# Patient Record
Sex: Female | Born: 2000 | Race: Black or African American | Hispanic: No | Smoking: Never smoker
Health system: Southern US, Community
[De-identification: ages and names within clinical notes are randomized; demographics above are authoritative.]

## PROBLEM LIST (undated history)

## (undated) ENCOUNTER — Inpatient Hospital Stay: Payer: Self-pay

## (undated) DIAGNOSIS — O169 Unspecified maternal hypertension, unspecified trimester: Secondary | ICD-10-CM

## (undated) DIAGNOSIS — D649 Anemia, unspecified: Secondary | ICD-10-CM

## (undated) HISTORY — PX: NO PAST SURGERIES: SHX2092

---
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
## 2020-09-12 NOTE — L&D Delivery Note (Signed)
Delivery Note  Date of delivery: 01/21/2021 Estimated Date of Delivery: 02/25/21 No LMP recorded. Patient is pregnant. EGA: [redacted]w[redacted]d  Delivery Note At 8:04 AM a viable female was delivered via Vaginal, Spontaneous (Presentation: Left Occiput Anterior).  APGAR: 9, 9; weight  pending. Placenta status: Spontaneous, Intact.  Cord: 3 vessels with the following complications: None.  Cord pH: n/a  First Stage: Labor onset: 0350 Augmentation : none Analgesia /Anesthesia intrapartum: Epidural  SROM at 0613  Olin Hauser presented to L&D with active labor. Marland Kitchen Epidural placed.   Second Stage: Complete dilation at 0731 Onset of pushing at 0731 FHR second stage Cat II Delivery at 0804 on 01/21/2021  She progressed to complete and had a spontaneous vaginal birth of a live female over an intact perineum. The fetal head was delivered in OA position with restitution to LOA. 1x nuchal cord resolved with summersault maneuver. Anterior then posterior shoulders delivered spontaneously. Baby placed on mom's abdomen and attended to by transition RN. Cord clamped and cut when pulseless by pt's mother. Cord blood obtained for newborn labs.  Third Stage: Placenta delivered  intact with 3VC at 0809 Placenta disposition: Pathology Uterine tone bogy at first with mod bleeding - massaged to firm and methergine 0.2mg  given IM Left room with bleeding min IV pitocin given for hemorrhage prophylaxis  Anesthesia: Epidural Episiotomy:  none Lacerations:  none Suture Repair: n/a Est. Blood Loss (mL):   Complications: none  Mom to postpartum.  Baby to Couplet care / Skin to Skin.  Newborn: Birth Weight: pending  Apgar Scores: 9, 9 Feeding planned: breastfeeding   Cyril Mourning, CNM 01/21/2021 8:22 AM

## 2020-12-15 LAB — HEPATITIS C ANTIBODY: HCV Ab: NEGATIVE

## 2020-12-15 LAB — OB RESULTS CONSOLE HEPATITIS B SURFACE ANTIGEN: Hepatitis B Surface Ag: NEGATIVE

## 2020-12-15 LAB — OB RESULTS CONSOLE RUBELLA ANTIBODY, IGM: Rubella: IMMUNE

## 2020-12-15 LAB — OB RESULTS CONSOLE HIV ANTIBODY (ROUTINE TESTING): HIV: NONREACTIVE

## 2020-12-15 LAB — OB RESULTS CONSOLE RPR: RPR: NONREACTIVE

## 2020-12-15 LAB — OB RESULTS CONSOLE VARICELLA ZOSTER ANTIBODY, IGG: Varicella: IMMUNE

## 2021-01-13 ENCOUNTER — Observation Stay
Admission: EM | Admit: 2021-01-13 | Discharge: 2021-01-13 | Disposition: A | Discharge status: Home / Self Care | Payer: Medicaid Other | Attending: Obstetrics and Gynecology | Admitting: Obstetrics and Gynecology

## 2021-01-13 ENCOUNTER — Encounter: Payer: Self-pay | Admitting: Obstetrics and Gynecology

## 2021-01-13 ENCOUNTER — Other Ambulatory Visit: Payer: Self-pay

## 2021-01-13 DIAGNOSIS — Z3A33 33 weeks gestation of pregnancy: Secondary | ICD-10-CM | POA: Insufficient documentation

## 2021-01-13 DIAGNOSIS — R109 Unspecified abdominal pain: Secondary | ICD-10-CM | POA: Diagnosis present

## 2021-01-13 DIAGNOSIS — O26893 Other specified pregnancy related conditions, third trimester: Principal | ICD-10-CM | POA: Insufficient documentation

## 2021-01-13 DIAGNOSIS — R102 Pelvic and perineal pain: Secondary | ICD-10-CM | POA: Insufficient documentation

## 2021-01-13 DIAGNOSIS — O26899 Other specified pregnancy related conditions, unspecified trimester: Secondary | ICD-10-CM | POA: Diagnosis present

## 2021-01-13 HISTORY — DX: Anemia, unspecified: D64.9

## 2021-01-13 LAB — URINALYSIS, ROUTINE W REFLEX MICROSCOPIC
Bilirubin Urine: NEGATIVE
Glucose, UA: NEGATIVE mg/dL
Hgb urine dipstick: NEGATIVE
Ketones, ur: NEGATIVE mg/dL
Nitrite: NEGATIVE
Protein, ur: 30 mg/dL — AB
Specific Gravity, Urine: 1.03 (ref 1.005–1.030)
pH: 6 (ref 5.0–8.0)

## 2021-01-13 LAB — CHLAMYDIA/NGC RT PCR (ARMC ONLY)
Chlamydia Tr: NOT DETECTED
N gonorrhoeae: NOT DETECTED

## 2021-01-13 LAB — WET PREP, GENITAL
Clue Cells Wet Prep HPF POC: NONE SEEN
Sperm: NONE SEEN
Trich, Wet Prep: NONE SEEN
Yeast Wet Prep HPF POC: NONE SEEN

## 2021-01-13 MED ORDER — CALCIUM CARBONATE ANTACID 500 MG PO CHEW: 2.0000 | CHEWABLE_TABLET | ORAL | Status: DC | PRN

## 2021-01-13 MED ORDER — ACETAMINOPHEN 325 MG PO TABS: 650.0000 mg | ORAL_TABLET | ORAL | Status: DC | PRN

## 2021-01-13 MED ORDER — DOCUSATE SODIUM 100 MG PO CAPS: 100.0000 mg | ORAL_CAPSULE | Freq: Every day | ORAL | Status: DC

## 2021-01-13 MED ORDER — TERBUTALINE SULFATE 1 MG/ML IJ SOLN
0.2500 mg | Freq: Once | INTRAMUSCULAR | Status: AC
  Administered 2021-01-13: 0.25 mg via SUBCUTANEOUS
  Filled 2021-01-13: qty 1

## 2021-01-13 MED ORDER — PRENATAL MULTIVITAMIN CH: 1.0000 | ORAL_TABLET | Freq: Every day | ORAL | Status: DC

## 2021-01-13 MED ORDER — ZOLPIDEM TARTRATE 5 MG PO TABS: 5.0000 mg | ORAL_TABLET | Freq: Every evening | ORAL | Status: DC | PRN

## 2021-01-13 NOTE — OB Triage Note (Signed)
Pt discharged in good condition w/ mom. RN at bedside for discharge instructions to come back with any vaginal bleeding, LOF, ctx, decreased fetal movement, ect. Pt verbalized understanding.

## 2021-01-13 NOTE — Discharge Summary (Signed)
Bethany Macdonald is a 20 y.o. female. She is at [redacted]w[redacted]d gestation. No LMP recorded. Patient is pregnant. Estimated Date of Delivery: 02/25/21  Prenatal care site: [facility]  Current pregnancy complicated by:  1. Late PNC- no records 2. Dating by [place] at 30.5wks at [facility] 3. Anemia- taking PO iron supplement  Chief complaint: sharp lower abd pains since yesterday afternoon.  - denies LOF or vaginal discharge.  - No VB - no recent IC.  - last prenatal visit about 3 weeks ago and next visit is later this week.   S: Resting comfortably. no CTX, no VB.no LOF,  Active fetal movement. Denies: HA, visual changes, SOB, or RUQ/epigastric pain  Maternal Medical History:   Past Medical History:  Diagnosis Date  . Anemia     Past Surgical History:  Procedure Laterality Date  . NO PAST SURGERIES      Allergies  Allergen Reactions  . Bee Venom     Prior to Admission medications   Medication Sig Start Date End Date Taking? Authorizing Provider  ferrous sulfate 325 (65 FE) MG EC tablet Take 325 mg by mouth 3 (three) times daily with meals.   Yes [provider]  albuterol (PROVENTIL HFA;VENTOLIN HFA) 108 (90 Base) MCG/ACT inhaler Inhale 2 puffs into the lungs every 6 (six) hours as needed for wheezing or shortness of breath. Patient not taking: Reported on 01/13/2021 11/30/17   Enid Derry, PA-C  amoxicillin (AMOXIL) 400 MG/5ML suspension Take 5 mLs (400 mg total) by mouth 3 (three) times daily. For 10 days Patient not taking: Reported on 01/13/2021 10/23/17   Faythe Ghee, PA-C  amoxicillin (AMOXIL) 400 MG/5ML suspension Take 400 mg by mouth 3 (three) times daily. Patient not taking: Reported on 01/13/2021    [provider]  brompheniramine-pseudoephedrine-DM 30-2-10 MG/5ML syrup Take 5 mLs by mouth 4 (four) times daily as needed. Patient not taking: Reported on 01/13/2021 10/24/17   Joni Reining, PA-C      Social History: She  reports that she has never  smoked. She has never used smokeless tobacco. She reports that she does not drink alcohol and does not use drugs.  Family History:  no history of gyn cancers  Review of Systems: A full review of systems was performed and negative except as noted in the HPI.     O:  There were no vitals taken for this visit. No results found for this or any previous visit (from the past 48 hour(s)).   Constitutional: NAD, AAOx3  HE/ENT: extraocular movements grossly intact, moist mucous membranes CV: RRR PULM: nl respiratory effort, CTABL     Abd: gravid, non-tender, non-distended, soft      Ext: Non-tender, Nonedematous   Psych: mood appropriate, speech normal Pelvic: SSE done: moderate erythema. Small amount yellow tinged dc noted. Cervix long and closed.    Fetal  monitoring: Cat I Appropriate for GA Baseline: 140bpm Variability: moderate Accelerations: present x >2 Decelerations absent Time    A/P: 20 y.o. [redacted]w[redacted]d here for antenatal surveillance for pelvic and abdominal pain.   Principle Diagnosis:  High risk pregnancy in third trimester   Labor: not present.   Fetal Wellbeing: Reassuring Cat 1 tracing with Reactive NST   UA  Poorly hydrated, given PO fluids for hydration.   Negative wet prep and GC/CT  urine culture added on.   D/c home stable, precautions reviewed, follow-up as scheduled.    Randa Ngo, CNM 01/13/2021  12:44 AM

## 2021-01-13 NOTE — OB Triage Note (Signed)
Pt G1P0 [redacted]w[redacted]d presents to birthplace via ED w/ c/o low abdominal pain radiating to vagina. Reports sharp constant pain 7.5/10 starting this afternoon. States possible ctx, not sure, no LOF, no vaginal bleeding, and positive fetal movement. VSS.

## 2021-01-14 LAB — CULTURE, OB URINE: Culture: NO GROWTH

## 2021-01-16 ENCOUNTER — Encounter: Payer: Self-pay | Admitting: Obstetrics and Gynecology

## 2021-01-16 ENCOUNTER — Observation Stay
Admission: EM | Admit: 2021-01-16 | Discharge: 2021-01-17 | Disposition: A | Discharge status: Home / Self Care | Payer: Medicaid Other | Attending: Certified Nurse Midwife | Admitting: Certified Nurse Midwife

## 2021-01-16 ENCOUNTER — Other Ambulatory Visit: Payer: Self-pay

## 2021-01-16 DIAGNOSIS — R102 Pelvic and perineal pain: Secondary | ICD-10-CM | POA: Insufficient documentation

## 2021-01-16 DIAGNOSIS — O26893 Other specified pregnancy related conditions, third trimester: Secondary | ICD-10-CM | POA: Diagnosis present

## 2021-01-16 DIAGNOSIS — Z3A34 34 weeks gestation of pregnancy: Secondary | ICD-10-CM | POA: Insufficient documentation

## 2021-01-16 LAB — URINALYSIS, COMPLETE (UACMP) WITH MICROSCOPIC
Bilirubin Urine: NEGATIVE
Glucose, UA: NEGATIVE mg/dL
Hgb urine dipstick: NEGATIVE
Ketones, ur: NEGATIVE mg/dL
Nitrite: NEGATIVE
Protein, ur: NEGATIVE mg/dL
Specific Gravity, Urine: 1.011 (ref 1.005–1.030)
pH: 7 (ref 5.0–8.0)

## 2021-01-16 LAB — WET PREP, GENITAL
Clue Cells Wet Prep HPF POC: NONE SEEN
Sperm: NONE SEEN
Trich, Wet Prep: NONE SEEN
Yeast Wet Prep HPF POC: NONE SEEN

## 2021-01-16 NOTE — OB Triage Note (Signed)
Pt arrives G1 P0 with c/o lower pelvic pain and feeling less fetal movement since around 1900 this evening. Pt denies any N/V/D and is in NAD.

## 2021-01-17 DIAGNOSIS — O26893 Other specified pregnancy related conditions, third trimester: Secondary | ICD-10-CM | POA: Diagnosis not present

## 2021-01-17 NOTE — Discharge Instructions (Signed)
First Stage of Labor Labor is your body's natural process of moving your baby and other structures, including the placenta and umbilical cord, out of your uterus. There are three stages of labor. How long each stage lasts is different for every woman. But certain events happen during each stage that are the same for everyone.  The first stage starts when true labor begins. This stage ends when your cervix, which is the opening from your uterus into your vagina, is completely open (dilated).  The second stage begins when your cervix is fully dilated and you start pushing. This stage ends when your baby is born.  The third stage is the delivery of the organ that nourished your baby during pregnancy (placenta). First stage of labor As your due date gets closer, you may start to notice certain physical changes that mean labor is going to start soon. You may feel that your baby has dropped lower into your pelvis. You may experience irregular, often painless, contractions that go away when you walk around or lie down (Braxton Hicks contractions). This is also called false labor. The first stage of labor begins when you start having contractions that come at regular (evenly spaced) intervals and your cervix starts to get thinner and wider in preparation for your baby to pass through. Birth care providers measure the dilation of your cervix in centimeters (cm). One centimeter is a little less than one-half of an inch. The first stage ends when your cervix is dilated to 10 cm. The first stage of labor is divided into three phases:  Early phase.  Active phase.  Transitional phase. The length of the first stage of labor varies. It may be longer if this is your first pregnancy. You may spend most of this stage at home trying to relax and stay comfortable. How does this affect me? During the first stage of labor, you will move through three phases. What happens in the early phase?  You will start to have  regular contractions that last 30-60 seconds. Contractions may come every 5-20 minutes. Keep track of your contractions and call your birth care provider.  Your water may break during this phase.  You may notice a clear or slightly bloody discharge of mucus (mucus plug) from your vagina.  Your cervix will dilate to 3-6 cm. What happens in the active phase? The active phase usually lasts 3-5 hours. You may go to the hospital or birth center around this time. During the active phase:  Your contractions will become stronger, longer, and more uncomfortable.  Your contractions may last 45-90 seconds and come every 3-5 minutes.  You may feel lower back pain.  Your birth care providers may examine your cervix and feel your belly to find the position of your baby.  You may have a monitor strapped to your belly to measure your contractions and your baby's heart rate.  You may start using your pain management options.  Your cervix may be dilated to 6 cm and may start to dilate more quickly. What happens in the transitional phase? The transitional phase typically lasts from 30 minutes to 2 hours. At the end of this phase, your cervix will be fully dilated to 10 cm. During the transitional phase:  Contractions will get stronger and longer.  Contractions may last 60-90 seconds and come less than 2 minutes apart.  You may feel hot flashes, chills, or nausea. How does this affect my baby? During the first stage of labor, your baby will   gradually move down into your birth canal. Follow these instructions at home and in the hospital or birth center:  When labor first begins, try to stay calm. You are still in the early phase. If it is night, try to get some sleep. If it is day, try to relax and save your energy. You may want to make some calls and get ready to go to the hospital or birth center.  When you are in the early phase, try these methods to help ease discomfort: ? Deep breathing and  muscle relaxation. ? Taking a walk. ? Taking a warm bath or shower.  Drink some fluids and have a light snack if you feel like it.  Keep track of your contractions.  Based on the plan you created with your birth care provider, call when your contractions indicate it is time.  If your water breaks, note the time, color, and odor of the fluid.  When you are in the active phase, do your breathing exercises and rely on your support people and your team of birth care providers.   Contact a health care provider if:  Your contractions are strong and regular.  You have lower back pain or cramping.  Your water breaks.  You lose your mucus plug. Get help right away if you:  Have a severe headache that does not go away.  Have changes in your vision.  Have severe pain in your upper belly.  Do not feel the baby move.  Have bright red bleeding. Summary  The first stage of labor starts when true labor begins, and it ends when your cervix is dilated to 10 cm.  The first stage of labor has three phases: early, active, and transitional.  Your baby moves into the birth canal during the first stage of labor.  You may have contractions that become stronger and longer. You may also lose your mucus plug and have your water break.  Call your birth care provider when your contractions are frequent and strong enough to go to the hospital or birth center. This information is not intended to replace advice given to you by your health care provider. Make sure you discuss any questions you have with your health care provider. Document Revised: 12/20/2018 Document Reviewed: 11/12/2017 Elsevier Patient Education  2021 [employer]vier Inc.  

## 2021-01-17 NOTE — Discharge Summary (Addendum)
Patient ID: Bethany Macdonald MRN: [id number] DOB/AGE: August 26, 2001 19 y.o.  Admit date: 01/16/2021 Discharge date: 01/17/2021  Admission Diagnoses: lower pelvic pain  Discharge Diagnoses: Round ligament pain vs cramping  Prenatal Procedures: NST  Consults: None  Significant Diagnostic Studies:  Results for orders placed or performed during the hospital encounter of 01/16/21 (from the past 168 hour(s))  Wet prep, genital   Collection Time: 01/16/21 11:11 PM   Specimen: Vaginal  Result Value Ref Range   Yeast Wet Prep HPF POC NONE SEEN NONE SEEN   Trich, Wet Prep NONE SEEN NONE SEEN   Clue Cells Wet Prep HPF POC NONE SEEN NONE SEEN   WBC, Wet Prep HPF POC MANY (A) NONE SEEN   Sperm NONE SEEN   Urinalysis, Complete w Microscopic Urine, Clean Catch   Collection Time: 01/16/21 11:11 PM  Result Value Ref Range   Color, Urine YELLOW (A) YELLOW   APPearance HAZY (A) CLEAR   Specific Gravity, Urine 1.011 1.005 - 1.030   pH 7.0 5.0 - 8.0   Glucose, UA NEGATIVE NEGATIVE mg/dL   Hgb urine dipstick NEGATIVE NEGATIVE   Bilirubin Urine NEGATIVE NEGATIVE   Ketones, ur NEGATIVE NEGATIVE mg/dL   Protein, ur NEGATIVE NEGATIVE mg/dL   Nitrite NEGATIVE NEGATIVE   Leukocytes,Ua MODERATE (A) NEGATIVE   RBC / HPF 0-5 0 - 5 RBC/hpf   WBC, UA 11-20 0 - 5 WBC/hpf   Bacteria, UA FEW (A) NONE SEEN   Squamous Epithelial / LPF 0-5 0 - 5  Results for orders placed or performed during the hospital encounter of 01/13/21 (from the past 168 hour(s))  Wet prep, genital   Collection Time: 01/13/21 12:42 AM   Specimen: Cervical/Vaginal swab  Result Value Ref Range   Yeast Wet Prep HPF POC NONE SEEN NONE SEEN   Trich, Wet Prep NONE SEEN NONE SEEN   Clue Cells Wet Prep HPF POC NONE SEEN NONE SEEN   WBC, Wet Prep HPF POC MANY (A) NONE SEEN   Sperm NONE SEEN   Chlamydia/NGC rt PCR ([facility] only)   Collection Time: 01/13/21 12:42 AM   Specimen: Cervical/Vaginal swab  Result Value Ref Range   Specimen source  GC/Chlam ENDOCERVICAL    Chlamydia Tr NOT DETECTED NOT DETECTED   N gonorrhoeae NOT DETECTED NOT DETECTED  OB Urine Culture   Collection Time: 01/13/21 12:42 AM   Specimen: Urine, Random  Result Value Ref Range   Specimen Description      URINE, RANDOM Performed at [facility], [address], [place], [place] [address]    Special Requests      NONE Performed at [facility], [address], [place], [place] [address]    Culture      NO GROWTH Performed at [facility], [address], [place], [place] [address]    Report Status 01/14/2021 FINAL   Urinalysis, Routine w reflex microscopic Urine, Clean Catch   Collection Time: 01/13/21 12:42 AM  Result Value Ref Range   Color, Urine YELLOW (A) YELLOW   APPearance HAZY (A) CLEAR   Specific Gravity, Urine 1.030 1.005 - 1.030   pH 6.0 5.0 - 8.0   Glucose, UA NEGATIVE NEGATIVE mg/dL   Hgb urine dipstick NEGATIVE NEGATIVE   Bilirubin Urine NEGATIVE NEGATIVE   Ketones, ur NEGATIVE NEGATIVE mg/dL   Protein, ur 30 (A) NEGATIVE mg/dL   Nitrite NEGATIVE NEGATIVE   Leukocytes,Ua SMALL (A) NEGATIVE   RBC / HPF 0-5 0 - 5 RBC/hpf  WBC, UA 21-50 0 - 5 WBC/hpf   Bacteria, UA RARE (A) NONE SEEN   Squamous Epithelial / LPF 6-10 0 - 5   Mucus PRESENT     Treatments: none  Hospital Course:  This is a 20 y.o. G1P0 with IUP at [redacted]w[redacted]d seen for lower pelvic pain, noted to have a cervical exam of closed/thick/high.  No leaking of fluid and no bleeding. She was observed, fetal  heart rate monitoring remained reassuring, and she had no signs/symptoms of preterm labor or other maternal-fetal concerns.  She was deemed stable for discharge to home with outpatient follow up.  Discharge Physical Exam:  BP 118/69 (BP Location: Right Arm)   Pulse 96   Temp 98.5 F (36.9 C) (Oral)   Resp 18   Ht 5\' 2"  (1.575 m)   Wt 51.3 kg   BMI 20.67 kg/m   General: NAD CV: RRR Pulm: CTABL, nl effort ABD: s/nd/nt, gravid DVT  Evaluation: LE non-ttp, no evidence of DVT on exam.  NST: FHR baseline: 150 bpm Variability: moderate Accelerations: yes Decelerations: none Category/reactivity: reactive  TOCO: occasional SVE:  Dilation: Closed Effacement (%): 0 Cervical Position: Posterior Station: -3 Exam by:: Braden Cimo, CNM   Discharge Condition: Stable  Disposition: Discharge disposition: 01-Home or Self Care        Allergies as of 01/17/2021      Reactions   Bee Venom       Medication List    STOP taking these medications   albuterol 108 (90 Base) MCG/ACT inhaler Commonly known as: VENTOLIN HFA   amoxicillin 400 MG/5ML suspension Commonly known as: AMOXIL   brompheniramine-pseudoephedrine-DM 30-2-10 MG/5ML syrup     TAKE these medications   ferrous sulfate 325 (65 FE) MG EC tablet Take 325 mg by mouth 3 (three) times daily with meals.       Follow-up Information    Center, [facility]. Go on 01/19/2021.   Contact information: [address] [place] [phone]               Signed:  [phone], CNM 01/17/2021 12:05 AM

## 2021-01-21 ENCOUNTER — Inpatient Hospital Stay
Admission: EM | Admit: 2021-01-21 | Discharge: 2021-01-23 | DRG: 806 | Disposition: A | Discharge status: Home / Self Care | Payer: Medicaid Other | Attending: Certified Nurse Midwife | Admitting: Certified Nurse Midwife

## 2021-01-21 ENCOUNTER — Encounter: Payer: Self-pay | Admitting: Obstetrics and Gynecology

## 2021-01-21 ENCOUNTER — Inpatient Hospital Stay: Payer: Medicaid Other | Admitting: Anesthesiology

## 2021-01-21 ENCOUNTER — Other Ambulatory Visit: Payer: Self-pay

## 2021-01-21 DIAGNOSIS — O479 False labor, unspecified: Secondary | ICD-10-CM | POA: Diagnosis present

## 2021-01-21 DIAGNOSIS — D62 Acute posthemorrhagic anemia: Secondary | ICD-10-CM | POA: Diagnosis not present

## 2021-01-21 DIAGNOSIS — Z3A35 35 weeks gestation of pregnancy: Secondary | ICD-10-CM | POA: Diagnosis not present

## 2021-01-21 DIAGNOSIS — Z20822 Contact with and (suspected) exposure to covid-19: Secondary | ICD-10-CM | POA: Diagnosis present

## 2021-01-21 DIAGNOSIS — O9081 Anemia of the puerperium: Secondary | ICD-10-CM | POA: Diagnosis not present

## 2021-01-21 LAB — WET PREP, GENITAL
Clue Cells Wet Prep HPF POC: NONE SEEN
Sperm: NONE SEEN
Trich, Wet Prep: NONE SEEN
Yeast Wet Prep HPF POC: NONE SEEN

## 2021-01-21 LAB — CBC
HCT: 30 % — ABNORMAL LOW (ref 36.0–46.0)
Hemoglobin: 9.9 g/dL — ABNORMAL LOW (ref 12.0–15.0)
MCH: 26.8 pg (ref 26.0–34.0)
MCHC: 33 g/dL (ref 30.0–36.0)
MCV: 81.1 fL (ref 80.0–100.0)
Platelets: 208 10*3/uL (ref 150–400)
RBC: 3.7 MIL/uL — ABNORMAL LOW (ref 3.87–5.11)
RDW: 12.8 % (ref 11.5–15.5)
WBC: 10.2 10*3/uL (ref 4.0–10.5)
nRBC: 0 % (ref 0.0–0.2)

## 2021-01-21 LAB — TYPE AND SCREEN
ABO/RH(D): O POS
Antibody Screen: NEGATIVE

## 2021-01-21 LAB — RESP PANEL BY RT-PCR (FLU A&B, COVID) ARPGX2
Influenza A by PCR: NEGATIVE
Influenza B by PCR: NEGATIVE
SARS Coronavirus 2 by RT PCR: NEGATIVE

## 2021-01-21 LAB — ABO/RH: ABO/RH(D): O POS

## 2021-01-21 LAB — CHLAMYDIA/NGC RT PCR (ARMC ONLY)
Chlamydia Tr: NOT DETECTED
N gonorrhoeae: NOT DETECTED

## 2021-01-21 LAB — RPR: RPR Ser Ql: NONREACTIVE

## 2021-01-21 MED ORDER — TETANUS-DIPHTH-ACELL PERTUSSIS 5-2.5-18.5 LF-MCG/0.5 IM SUSY
0.5000 mL | PREFILLED_SYRINGE | Freq: Once | INTRAMUSCULAR | Status: DC
Start: 2021-01-22 — End: 2021-01-23

## 2021-01-21 MED ORDER — LIDOCAINE HCL (PF) 1 % IJ SOLN
INTRAMUSCULAR | Status: DC | PRN
  Administered 2021-01-21: 3 mL

## 2021-01-21 MED ORDER — SODIUM CHLORIDE 0.9 % IV SOLN: 1.0000 g | INTRAVENOUS | Status: DC

## 2021-01-21 MED ORDER — LACTATED RINGERS IV SOLN: 500.0000 mL | Freq: Once | INTRAVENOUS | Status: DC

## 2021-01-21 MED ORDER — FENTANYL 2.5 MCG/ML W/ROPIVACAINE 0.15% IN NS 100 ML EPIDURAL (ARMC)
EPIDURAL | Status: AC
  Filled 2021-01-21: qty 100

## 2021-01-21 MED ORDER — COCONUT OIL OIL
1.0000 "application " | TOPICAL_OIL | Status: DC | PRN
  Administered 2021-01-22: 1 via TOPICAL
  Filled 2021-01-21: qty 120

## 2021-01-21 MED ORDER — OXYCODONE HCL 5 MG PO TABS: 10.0000 mg | ORAL_TABLET | ORAL | Status: DC | PRN

## 2021-01-21 MED ORDER — FERROUS SULFATE 325 (65 FE) MG PO TABS
325.0000 mg | ORAL_TABLET | Freq: Two times a day (BID) | ORAL | Status: DC
Start: 2021-01-21 — End: 2021-01-23
  Administered 2021-01-21 – 2021-01-23 (×4): 325 mg via ORAL
  Filled 2021-01-21 (×4): qty 1

## 2021-01-21 MED ORDER — BUPIVACAINE HCL (PF) 0.25 % IJ SOLN
INTRAMUSCULAR | Status: DC | PRN
  Administered 2021-01-21: 6 mL via EPIDURAL

## 2021-01-21 MED ORDER — EPHEDRINE 5 MG/ML INJ: 10.0000 mg | INTRAVENOUS | Status: DC | PRN

## 2021-01-21 MED ORDER — OXYCODONE-ACETAMINOPHEN 5-325 MG PO TABS: 2.0000 | ORAL_TABLET | ORAL | Status: DC | PRN

## 2021-01-21 MED ORDER — IBUPROFEN 600 MG PO TABS
ORAL_TABLET | ORAL | Status: AC
  Filled 2021-01-21: qty 1

## 2021-01-21 MED ORDER — SIMETHICONE 80 MG PO CHEW: 80.0000 mg | CHEWABLE_TABLET | ORAL | Status: DC | PRN

## 2021-01-21 MED ORDER — DIPHENHYDRAMINE HCL 50 MG/ML IJ SOLN: 12.5000 mg | INTRAMUSCULAR | Status: DC | PRN

## 2021-01-21 MED ORDER — LACTATED RINGERS IV SOLN
500.0000 mL | INTRAVENOUS | Status: DC | PRN
  Administered 2021-01-21: 500 mL via INTRAVENOUS

## 2021-01-21 MED ORDER — PHENYLEPHRINE 40 MCG/ML (10ML) SYRINGE FOR IV PUSH (FOR BLOOD PRESSURE SUPPORT): 80.0000 ug | PREFILLED_SYRINGE | INTRAVENOUS | Status: DC | PRN

## 2021-01-21 MED ORDER — ONDANSETRON HCL 4 MG/2ML IJ SOLN: 4.0000 mg | Freq: Four times a day (QID) | INTRAMUSCULAR | Status: DC | PRN

## 2021-01-21 MED ORDER — TERBUTALINE SULFATE 1 MG/ML IJ SOLN
INTRAMUSCULAR | Status: AC
  Filled 2021-01-21: qty 1

## 2021-01-21 MED ORDER — DOCUSATE SODIUM 100 MG PO CAPS
100.0000 mg | ORAL_CAPSULE | Freq: Two times a day (BID) | ORAL | Status: DC
Start: 2021-01-22 — End: 2021-01-23
  Administered 2021-01-22: 100 mg via ORAL
  Filled 2021-01-21: qty 1

## 2021-01-21 MED ORDER — LACTATED RINGERS IV SOLN: INTRAVENOUS | Status: DC

## 2021-01-21 MED ORDER — FENTANYL 2.5 MCG/ML W/ROPIVACAINE 0.15% IN NS 100 ML EPIDURAL (ARMC)
12.0000 mL/h | EPIDURAL | Status: DC
  Administered 2021-01-21: 12 mL/h via EPIDURAL

## 2021-01-21 MED ORDER — METHYLERGONOVINE MALEATE 0.2 MG/ML IJ SOLN: 0.2000 mg | INTRAMUSCULAR | Status: DC | PRN

## 2021-01-21 MED ORDER — DIBUCAINE (PERIANAL) 1 % EX OINT: 1.0000 "application " | TOPICAL_OINTMENT | CUTANEOUS | Status: DC | PRN

## 2021-01-21 MED ORDER — SOD CITRATE-CITRIC ACID 500-334 MG/5ML PO SOLN: 30.0000 mL | ORAL | Status: DC | PRN

## 2021-01-21 MED ORDER — LIDOCAINE-EPINEPHRINE (PF) 1.5 %-1:200000 IJ SOLN
INTRAMUSCULAR | Status: DC | PRN
  Administered 2021-01-21: 3 mL via PERINEURAL

## 2021-01-21 MED ORDER — METHYLERGONOVINE MALEATE 0.2 MG/ML IJ SOLN
0.2000 mg | Freq: Once | INTRAMUSCULAR | Status: AC
  Administered 2021-01-21: 0.2 mg via INTRAMUSCULAR

## 2021-01-21 MED ORDER — LIDOCAINE HCL (PF) 1 % IJ SOLN: 30.0000 mL | INTRAMUSCULAR | Status: DC | PRN

## 2021-01-21 MED ORDER — PRENATAL MULTIVITAMIN CH
1.0000 | ORAL_TABLET | Freq: Every day | ORAL | Status: DC
  Administered 2021-01-22: 1 via ORAL
  Filled 2021-01-21 (×2): qty 1

## 2021-01-21 MED ORDER — WITCH HAZEL-GLYCERIN EX PADS
1.0000 "application " | MEDICATED_PAD | CUTANEOUS | Status: DC | PRN
  Filled 2021-01-21: qty 100

## 2021-01-21 MED ORDER — ACETAMINOPHEN 325 MG PO TABS
650.0000 mg | ORAL_TABLET | ORAL | Status: DC | PRN
  Administered 2021-01-22 – 2021-01-23 (×2): 650 mg via ORAL
  Filled 2021-01-21 (×2): qty 2

## 2021-01-21 MED ORDER — ONDANSETRON HCL 4 MG/2ML IJ SOLN
4.0000 mg | INTRAMUSCULAR | Status: DC | PRN
  Administered 2021-01-21: 4 mg via INTRAVENOUS
  Filled 2021-01-21: qty 2

## 2021-01-21 MED ORDER — ONDANSETRON HCL 4 MG PO TABS
4.0000 mg | ORAL_TABLET | ORAL | Status: DC | PRN
  Filled 2021-01-21: qty 1

## 2021-01-21 MED ORDER — METHYLERGONOVINE MALEATE 0.2 MG/ML IJ SOLN
INTRAMUSCULAR | Status: AC
  Filled 2021-01-21: qty 1

## 2021-01-21 MED ORDER — OXYCODONE-ACETAMINOPHEN 5-325 MG PO TABS: 1.0000 | ORAL_TABLET | ORAL | Status: DC | PRN

## 2021-01-21 MED ORDER — IBUPROFEN 600 MG PO TABS
600.0000 mg | ORAL_TABLET | Freq: Four times a day (QID) | ORAL | Status: DC
  Administered 2021-01-21 – 2021-01-23 (×8): 600 mg via ORAL
  Filled 2021-01-21 (×8): qty 1

## 2021-01-21 MED ORDER — OXYTOCIN-SODIUM CHLORIDE 30-0.9 UT/500ML-% IV SOLN
2.5000 [IU]/h | INTRAVENOUS | Status: DC
  Administered 2021-01-21: 2.5 [IU]/h via INTRAVENOUS
  Filled 2021-01-21: qty 1000

## 2021-01-21 MED ORDER — METHYLERGONOVINE MALEATE 0.2 MG PO TABS
0.2000 mg | ORAL_TABLET | ORAL | Status: DC | PRN
  Filled 2021-01-21: qty 1

## 2021-01-21 MED ORDER — ACETAMINOPHEN 325 MG PO TABS
650.0000 mg | ORAL_TABLET | Freq: Once | ORAL | Status: AC
  Administered 2021-01-21: 650 mg via ORAL
  Filled 2021-01-21: qty 2

## 2021-01-21 MED ORDER — DIPHENHYDRAMINE HCL 25 MG PO CAPS
25.0000 mg | ORAL_CAPSULE | Freq: Four times a day (QID) | ORAL | Status: DC | PRN
Start: 2021-01-21 — End: 2021-01-23

## 2021-01-21 MED ORDER — TERBUTALINE SULFATE 1 MG/ML IJ SOLN
0.2500 mg | Freq: Once | INTRAMUSCULAR | Status: AC
  Administered 2021-01-21: 0.25 mg via SUBCUTANEOUS

## 2021-01-21 MED ORDER — OXYCODONE HCL 5 MG PO TABS: 5.0000 mg | ORAL_TABLET | ORAL | Status: DC | PRN

## 2021-01-21 MED ORDER — ACETAMINOPHEN 325 MG PO TABS: 650.0000 mg | ORAL_TABLET | ORAL | Status: DC | PRN

## 2021-01-21 MED ORDER — BUTORPHANOL TARTRATE 1 MG/ML IJ SOLN
INTRAMUSCULAR | Status: AC
  Filled 2021-01-21: qty 1

## 2021-01-21 MED ORDER — OXYTOCIN BOLUS FROM INFUSION
333.0000 mL | Freq: Once | INTRAVENOUS | Status: DC
  Administered 2021-01-21: 333 mL via INTRAVENOUS

## 2021-01-21 MED ORDER — SODIUM CHLORIDE 0.9 % IV SOLN
2.0000 g | Freq: Once | INTRAVENOUS | Status: AC
  Administered 2021-01-21: 2 g via INTRAVENOUS
  Filled 2021-01-21: qty 2000

## 2021-01-21 MED ORDER — BENZOCAINE-MENTHOL 20-0.5 % EX AERO
1.0000 "application " | INHALATION_SPRAY | CUTANEOUS | Status: DC | PRN
  Administered 2021-01-21: 1 via TOPICAL
  Filled 2021-01-21: qty 56

## 2021-01-21 NOTE — Lactation Note (Signed)
This note was copied from a baby's chart. Lactation Consultation Note  Patient Name: Bethany Macdonald [id number] Date: 01/21/2021 Reason for consult: Initial assessment;Primapara;1st time breastfeeding;Late-preterm 34-36.6wks;Infant < 6lbs Age:20 hours  Lactation at bedside on MBU for first feeding. Baby born to G1P1 mother, SVD, 2hrs ago @ [redacted]w[redacted]d. Mom desires exclusive breastfeeding but understands the possible need for supplementation based upon blood sugar levels, and/or infants gestational age and size.  Baby has had trouble maintaining appropriate temperatures so was left double swaddled, and brought to mom in cross-cradle position with support pillow on L breast. Baby grasped the breast easily, had flanged top/bottom lips, and rhythmic sucking pattern. Swallows were intermittent during the 15 minute feeding, with suck pattern consistent.  Throughout feeding LC educated mom on positioning, alignment, latching, flange lips, early feeding cues, newborn feeding patterns and behaviors, and output expectations. Hand expression was taught on opposite breast- drops expressed. Hand expression was encouraged post feedings and between feedings for additional stimulation and opportunity to supply baby with additional nutrition.   Per pre-term protocol, mom will be set-up with DEBP at bedside by 6hrs PP. LC to return for future feedings and pump set-up.  Whiteboard updated with Lactation name/number. Encouraged mom to call out for ongoing breastfeeding support or with questions.  Maternal Data Has patient been taught Hand Expression?: Yes Does the patient have breastfeeding experience prior to this delivery?: No  Feeding Mother's Current Feeding Choice: Breast Milk  LATCH Score Latch: Grasps breast easily, tongue down, lips flanged, rhythmical sucking.  Audible Swallowing: A few with stimulation  Type of Nipple: Everted at rest and after stimulation  Comfort (Breast/Nipple): Soft /  non-tender  Hold (Positioning): Assistance needed to correctly position infant at breast and maintain latch.  LATCH Score: 8   Lactation Tools Discussed/Used    Interventions Interventions: Breast feeding basics reviewed;Assisted with latch;Hand express;Adjust position;Support pillows;Position options;Education  Discharge    Consult Status Consult Status: Follow-up Date: 01/21/21 Follow-up type: In-patient    Danford Bad 01/21/2021, 10:47 AM

## 2021-01-21 NOTE — Anesthesia Procedure Notes (Signed)
Epidural Patient location during procedure: OB Start time: 01/21/2021 5:26 AM End time: 01/21/2021 5:42 AM  Staffing Anesthesiologist: Yves Dill, MD Performed: anesthesiologist   Preanesthetic Checklist Completed: patient identified, IV checked, site marked, risks and benefits discussed, surgical consent, monitors and equipment checked, pre-op evaluation and timeout performed  Epidural Patient position: sitting Prep: Betadine Patient monitoring: heart rate, continuous pulse ox and blood pressure Approach: midline Location: L3-L4 Injection technique: LOR air  Needle:  Needle type: Tuohy  Needle gauge: 17 G Needle length: 9 cm and 9 Catheter type: closed end flexible Catheter size: 19 Gauge Test dose: negative and 1.5% lidocaine with Epi 1:200 K  Assessment Events: blood not aspirated, injection not painful, no injection resistance, no paresthesia and negative IV test  Additional Notes Time out called.  Patient placed in sitting position.  Back prepped and draped in sterile fashion.  A 17G Tuohy needle was advanced into the epidural space by a loss of resistance technique.  The epidural catheter was threaded 3 cm and the TD was negative.  No blood, fluid or paresthesias.  The patient tolerated the procedure well and the catheter was affixed to the back in sterile fashion.Reason for block:procedure for pain

## 2021-01-21 NOTE — OB Triage Note (Signed)
Patient is a 20 yo, G1P0, at 35 weeks 0 days. Patient presents with complaints of contractions every 5 minutes that started at approximately 0030 rated 9/10. Patient denies any vaginal bleeding or LOF. Patient reports decreased fetal movement. Monitors applied and assessing. VSS. Initial fetal heart tone 145. Will continue to monitor.

## 2021-01-21 NOTE — Lactation Note (Signed)
This note was copied from a baby's chart. Lactation Consultation Note  Patient Name: Bethany Macdonald [id number] Date: 01/21/2021 Reason for consult: Follow-up assessment;Mother's request;Primapara;Late-preterm 34-36.6wks;Infant < 6lbs Age:20 hours  Lactation called to bedside by mom to assist with feeding. 2 BS have been above 80. Baby at breast in cradle hold, mom needing assistance with full latch.  LC instructed and guided mom on how to sandwich the breast tissue and bringing baby into the breast. Baby grasped the breast easily with flanged top/bottom lips and had rhythmic sucking pattern. Encouraged mom to keep him with blanket on, but alert for full feeding at the breast. Mom verbalizes understanding.  Maternal Data Has patient been taught Hand Expression?: Yes Does the patient have breastfeeding experience prior to this delivery?: No  Feeding Mother's Current Feeding Choice: Breast Milk  LATCH Score Latch: Grasps breast easily, tongue down, lips flanged, rhythmical sucking.  Audible Swallowing: A few with stimulation  Type of Nipple: Everted at rest and after stimulation  Comfort (Breast/Nipple): Soft / non-tender  Hold (Positioning): Assistance needed to correctly position infant at breast and maintain latch.  LATCH Score: 8   Lactation Tools Discussed/Used    Interventions Interventions: Breast feeding basics reviewed;Assisted with latch;Adjust position;Support pillows  Discharge    Consult Status Consult Status: Follow-up Date: 01/21/21 Follow-up type: In-patient    Danford Bad 01/21/2021, 12:27 PM

## 2021-01-21 NOTE — H&P (Signed)
OB History & Physical   History of Present Illness:  Chief Complaint:   HPI:  Bethany Macdonald is a 20 y.o. G1P0 female at [redacted]w[redacted]d dated by ultrasound.  She presents to L&D for uterine contractions.  She reports:  -active fetal movement -no leakage of fluid -no vaginal bleeding -onset of contractions around midnight   Pregnancy Issues: 1. No prenatal records available    Maternal Medical History:   Past Medical History:  Diagnosis Date  . Anemia     Past Surgical History:  Procedure Laterality Date  . NO PAST SURGERIES      Allergies  Allergen Reactions  . Bee Venom     Prior to Admission medications   Medication Sig Start Date End Date Taking? Authorizing Provider  ferrous sulfate 325 (65 FE) MG EC tablet Take 325 mg by mouth 3 (three) times daily with meals.   Yes [provider]  Prenatal Vit-Fe Fumarate-FA (PRENATAL MULTIVITAMIN) TABS tablet Take 1 tablet by mouth daily at 12 noon.   Yes [provider]     [facility]ren[facility] care site: [facility]   Social History: She  reports that she has never smoked. She has never used smokeless tobacco. She reports that she does not drink alcohol and does not use drugs.  Family History: family history is not on file.   Review of Systems: A full review of systems was performed and negative except as noted in the HPI.    Physical Exam:  Vital Signs: BP 112/72 (BP Location: Left Arm)   Pulse 87   Temp 98.2 F (36.8 C) (Oral)   Resp 16   General:   alert and cooperative  Skin:  normal  Neurologic:    Alert & oriented x 3  Lungs:   nl effort  Heart:   regular rate and rhythm  Abdomen:  gravid, soft and nontender between UCs  Extremities: : non-tender, symmetric    EFW: 01/20/21   2444g  25%   Results for orders placed or performed during the hospital encounter of 01/21/21 (from the past 24 hour(s))  Wet prep, genital     Status: Abnormal   Collection Time: 01/21/21  2:07 AM   Specimen:  Vaginal/Rectal  Result Value Ref Range   Yeast Wet Prep HPF POC NONE SEEN NONE SEEN   Trich, Wet Prep NONE SEEN NONE SEEN   Clue Cells Wet Prep HPF POC NONE SEEN NONE SEEN   WBC, Wet Prep HPF POC MODERATE (A) NONE SEEN   Sperm NONE SEEN     Pertinent Results:  Prenatal Labs: Blood type/Rh pending  Antibody screen   Rubella   Varicella   RPR pending  HBsAg   HIV   GC Neg  Chlamydia Neg  Genetic screening   1 hour GTT   3 hour GTT   GBS pending   FHT: FHR: 145 bpm, variability: moderate,  accelerations:  Present,  decelerations:  Absent Category/reactivity:  Category I TOCO: regular, every 3-4 minutes SVE: Dilation: 7 / Effacement (%): 90 / Station: -1     Assessment:  Bethany Macdonald is a 20 y.o. G1P0 female at [redacted]w[redacted]d with active labor.   Plan:  1. Admit to Labor & Delivery; consents reviewed and obtained  2. Fetal Well being  - Fetal Tracing: cat I - GBS unknown - treatment ordered - Presentation: vtx confirmed by SVE   3. Routine OB: - Prenatal labs reviewed, as above - Rh - lab pending - CBC & T&S  on admit - Clear fluids, IVF  4. Monitoring of Labor -  Contractions by external toco in place -  Plan for continuous fetal monitoring  -  Maternal pain control as desired: IVPM, nitrous, regional anesthesia - Anticipate vaginal delivery  5. Post Partum Planning: - Infant feeding: TBD - Contraception: TBD  Ludmila Ebarb, CNM 01/21/2021 4:05 AM

## 2021-01-21 NOTE — Anesthesia Preprocedure Evaluation (Signed)
Anesthesia Evaluation  Patient identified by MRN, date of birth, ID band Patient awake    Reviewed: Allergy & Precautions, NPO status , Patient's Chart, lab work & pertinent test results  Airway Mallampati: II  TM Distance: >3 FB     Dental  (+) Teeth Intact   Pulmonary neg pulmonary ROS,    Pulmonary exam normal        Cardiovascular negative cardio ROS Normal cardiovascular exam     Neuro/Psych negative neurological ROS     GI/Hepatic negative GI ROS, Neg liver ROS,   Endo/Other  negative endocrine ROS  Renal/GU negative Renal ROS  negative genitourinary   Musculoskeletal negative musculoskeletal ROS (+)   Abdominal Normal abdominal exam  (+)   Peds negative pediatric ROS (+)  Hematology  (+) anemia ,   Anesthesia Other Findings   Reproductive/Obstetrics (+) Pregnancy                             Anesthesia Physical Anesthesia Plan  ASA: II  Anesthesia Plan: Epidural   Post-op Pain Management:    Induction:   PONV Risk Score and Plan:   Airway Management Planned: Natural Airway  Additional Equipment:   Intra-op Plan:   Post-operative Plan:   Informed Consent: I have reviewed the patients History and Physical, chart, labs and discussed the procedure including the risks, benefits and alternatives for the proposed anesthesia with the patient or authorized representative who has indicated his/her understanding and acceptance.     Dental advisory given  Plan Discussed with: CRNA and Surgeon  Anesthesia Plan Comments:         Anesthesia Quick Evaluation

## 2021-01-21 NOTE — Discharge Summary (Signed)
Obstetrical Discharge Summary  Patient Name: Bethany Macdonald DOB: [birth year] MRN: [id number]  Date of Admission: 01/21/2021 Date of Delivery: 01/21/21 Delivered by: Anselm Pancoast Date of Discharge: 01/23/2021  Primary OB: [facility] LMP:No LMP recorded. EDC Estimated Date of Delivery: 02/25/21 Gestational Age at Delivery: [redacted]w[redacted]d   Antepartum complications: iron deficiency anemia  Admitting Diagnosis: Active labor at 35 weeks Secondary Diagnosis: Patient Active Problem List   Diagnosis Date Noted  . NSVD (normal spontaneous vaginal delivery) 01/21/2021    Augmentation: none Complications: None  Intrapartum complications/course: see delivery note Delivery Type: spontaneous vaginal delivery Anesthesia: epidural Placenta: spontaneous Laceration: "skid mark" right labial Episiotomy: none Newborn Data: Live born female  Birth Weight: 4#15 APGAR: 9, 9  Newborn Delivery   Birth date/time: 01/[birth year]/2021 08:04:00 Delivery type: Vaginal, Spontaneous      19 y.o. G1P0101 at [redacted]w[redacted]d presenting with active labor, SROM with clear fluid.  She progressed to complete and pushed over an intact perineum and delivered the fetal head, followed promptly by the shoulders. She was in control the whole time, and the baby placed on the maternal abdomen. Delayed cord clamping and the pt's mother cut the baby's cord, while the baby was skin to skin. The placenta delivered spontaneously and intact. "skid mark" right labial laceration that was hemostatic and did not require a stitch. Mom and baby tolerated the procedure well.   Postpartum Procedures: none  Post partum course:  Patient had an uncomplicated postpartum course.  By time of discharge on PPD#2, her pain was controlled on oral pain medications; she had appropriate lochia and was ambulating, voiding without difficulty and tolerating regular diet.  She was deemed stable for discharge to home.    Discharge Physical Exam:  BP 110/78 (BP Location:  Right Arm)   Pulse 85   Temp 98.3 F (36.8 C) (Oral)   Resp 18   SpO2 100%   Breastfeeding Unknown   General: alert and no distress Pulm: normal respiratory effort Lochia: appropriate Abdomen: soft, NT Uterine Fundus: firm, below umbilicus Extremities: No evidence of DVT seen on physical exam. No lower extremity edema.  EdinburghInocente Salles Postnatal Depression Scale Screening Tool 01/21/2021  I have been able to laugh and see the funny side of things. 0  I have looked forward with enjoyment to things. 0  I have blamed myself unnecessarily when things went wrong. 1  I have been anxious or worried for no good reason. 2  I have felt scared or panicky for no good reason. 1  Things have been getting on top of me. 1  I have been so unhappy that I have had difficulty sleeping. 0  I have felt sad or miserable. 0  I have been so unhappy that I have been crying. 0  The thought of harming myself has occurred to me. 0  Edinburgh Postnatal Depression Scale Total 5     Labs: CBC Latest Ref Rng & Units 01/22/2021 01/21/2021  WBC 4.0 - 10.5 K/uL 12.2(H) 10.2  Hemoglobin 12.0 - 15.0 g/dL 3.8(B) 0.1(B)  Hematocrit 36.0 - 46.0 % 25.9(L) 30.0(L)  Platelets 150 - 400 K/uL 208 208   O POS Performed at [facility], [address] [place], [place] [address]  Hemoglobin  Date Value Ref Range Status  01/22/2021 8.5 (L) 12.0 - 15.0 g/dL Final   HCT  Date Value Ref Range Status  01/22/2021 25.9 (L) 36.0 - 46.0 % Final    Disposition: stable, discharge to home Baby Feeding: breastmilk Baby  Disposition: home with mom  Contraception: TBD  Prenatal Labs:  Blood type/Rh O pos  Antibody screen Neg  Rubella immune  Varicella immune  RPR NR   HBsAg neg  HIV neg  GC Neg  Chlamydia Neg  Genetic screening   1 hour GTT   3 hour GTT   GBS pending   Rh Immune globulin given: n/a Rubella vaccine given: n/a Varicella vaccine given: n/a Tdap vaccine given in AP or PP  setting: pt reports received.  Flu vaccine given in AP or PP setting: received per pt.   Plan: Bethany Macdonald was discharged to home in good condition. Follow-up appointment with delivering provider in 6 weeks.  Discharge Instructions: Per After Visit Summary. Activity: Advance as tolerated. Pelvic rest for 6 weeks.   Diet: Regular Discharge Medications: Allergies as of 01/23/2021      Reactions   Bee Venom       Medication List    TAKE these medications   acetaminophen 325 MG tablet Commonly known as: Tylenol Take 2 tablets (650 mg total) by mouth every 4 (four) hours as needed (for pain scale < 4).   benzocaine-Menthol 20-0.5 % Aero Commonly known as: DERMOPLAST Apply 1 application topically as needed for irritation (perineal discomfort).   coconut oil Oil Apply 1 application topically as needed.   docusate sodium 100 MG capsule Commonly known as: COLACE Take 1 capsule (100 mg total) by mouth 2 (two) times daily.   ferrous sulfate 325 (65 FE) MG EC tablet Take 325 mg by mouth 3 (three) times daily with meals.   ibuprofen 600 MG tablet Commonly known as: ADVIL Take 1 tablet (600 mg total) by mouth every 6 (six) hours.   prenatal multivitamin Tabs tablet Take 1 tablet by mouth daily at 12 noon.   witch hazel-glycerin pad Commonly known as: TUCKS Apply 1 application topically as needed for hemorrhoids.      Outpatient follow up:   Follow-up Information    Haroldine Laws, CNM. Schedule an appointment as soon as possible for a visit in 6 week(s).   Specialty: Certified Nurse Midwife Why: postpartum appointment, may opt to followup at [facility].  Contact information: [address] [place] [address] [phone]               Signed: Randa Ngo, CNM  01/23/2021 11:04 AM

## 2021-01-21 NOTE — Lactation Note (Signed)
This note was copied from a baby's chart. Lactation Consultation Note  Patient Name: Bethany Macdonald [id number] Date: 01/21/2021 Reason for consult: Follow-up assessment;Mother's request;Primapara;Late-preterm 34-36.6wks;Infant < 6lbs Age:20 hours   Lactation follow-up per mom's request. Mom attempting to feed baby, baby sleeping comfortably and uninterested. LC educated mom on newborn sleep/feeding patterns.  Discussed pump set-up and education due to infants age and size. Mom agreed to pump set-up and use.  Mom educated on set-up, use, cleaning, milk storage guidelines. Instructed to pump every 3 hours and continue to put baby to breast on demand, opting for attempts every 3 hours.  LC assisted with first pumping session and mom expressed 18mL of colostrum combined from both breasts. This left at bedside to use as needed based on age/sugars/temp/feeding difficulties or otherwise indicated by pediatrician.  Maternal Data Has patient been taught Hand Expression?: Yes Does the patient have breastfeeding experience prior to this delivery?: No  Feeding Mother's Current Feeding Choice: Breast Milk  LATCH Score Latch: Grasps breast easily, tongue down, lips flanged, rhythmical sucking.  Audible Swallowing: A few with stimulation  Type of Nipple: Everted at rest and after stimulation  Comfort (Breast/Nipple): Soft / non-tender  Hold (Positioning): Assistance needed to correctly position infant at breast and maintain latch.  LATCH Score: 8   Lactation Tools Discussed/Used Tools: Pump Breast pump type: Double-Electric Breast Pump Pump Education: Setup, frequency, and cleaning;Milk Storage Reason for Pumping: 35w/preterm protocol Pumping frequency: q 3 hours Pumped volume: 19 mL  Interventions Interventions: Breast feeding basics reviewed;DEBP;Education  Discharge    Consult Status Consult Status: Follow-up Date: 01/22/21 Follow-up type: In-patient    Danford Bad 01/21/2021, 3:57 PM

## 2021-01-22 LAB — CBC
HCT: 25.9 % — ABNORMAL LOW (ref 36.0–46.0)
Hemoglobin: 8.5 g/dL — ABNORMAL LOW (ref 12.0–15.0)
MCH: 26.5 pg (ref 26.0–34.0)
MCHC: 32.8 g/dL (ref 30.0–36.0)
MCV: 80.7 fL (ref 80.0–100.0)
Platelets: 208 10*3/uL (ref 150–400)
RBC: 3.21 MIL/uL — ABNORMAL LOW (ref 3.87–5.11)
RDW: 13.2 % (ref 11.5–15.5)
WBC: 12.2 10*3/uL — ABNORMAL HIGH (ref 4.0–10.5)
nRBC: 0 % (ref 0.0–0.2)

## 2021-01-22 LAB — SURGICAL PATHOLOGY

## 2021-01-22 NOTE — Anesthesia Postprocedure Evaluation (Signed)
Anesthesia Post Note  Patient: Bethany Macdonald  Procedure(s) Performed: AN AD HOC LABOR EPIDURAL  Patient location during evaluation: Mother Baby Anesthesia Type: Epidural Level of consciousness: awake and alert Pain management: pain level controlled Vital Signs Assessment: post-procedure vital signs reviewed and stable Respiratory status: spontaneous breathing, nonlabored ventilation and respiratory function stable Cardiovascular status: stable Postop Assessment: no headache, no backache, epidural receding, able to ambulate and no apparent nausea or vomiting Anesthetic complications: no   No complications documented.   Last Vitals:  Vitals:   01/21/21 2316 01/22/21 0736  BP: 108/70 122/88  Pulse: 97 93  Resp: 20 18  Temp: 37.1 C 36.7 C  SpO2: 100%     Last Pain:  Vitals:   01/22/21 0736  TempSrc: Oral  PainSc:                  Jeanine Luz

## 2021-01-22 NOTE — Lactation Note (Addendum)
This note was copied from a baby's chart. Lactation Consultation Note  Patient Name: Bethany Macdonald Date: 01/22/2021 Reason for consult: Follow-up assessment Age:20 hours  Maternal Data Has patient been taught Hand Expression?: Yes Does the patient have breastfeeding experience prior to this delivery?: No  Feeding Mother's Current Feeding Choice: Breast Milk Mom desires to breastfeed baby, after demonstrating hand expression of few drops of colostrum, mom latched baby to  Right breast in cradle hold, baby sleepy at breast so mom encouraged to stimulate baby to suck, occ swallow heard with stimulation, mom encouraged to stimulate baby for a feeding of at least 15 min on first side, then burp and offer left breast, pump breasts after nursing to obtain supplement  LATCH Score Latch: Grasps breast easily, tongue down, lips flanged, rhythmical sucking.  Audible Swallowing: A few with stimulation  Type of Nipple: Everted at rest and after stimulation  Comfort (Breast/Nipple): Soft / non-tender  Hold (Positioning): Assistance needed to correctly position infant at breast and maintain latch.  LATCH Score: 8   Lactation Tools Discussed/Used Breast pump type: Double-Electric Breast Pump Reason for Pumping: to stimulate milk production and provide supplement  Interventions Interventions: Assisted with latch;Hand express;Breast compression;Support pillows;DEBP  Discharge Pump: DEBP WIC Program: Yes  Consult Status Consult Status: Follow-up Date: 01/23/21 Follow-up type: In-patient    Dyann Kief 01/22/2021, 2:44 PM

## 2021-01-22 NOTE — Progress Notes (Signed)
Post Partum Day 1  Subjective: Doing well, no concerns. Ambulating without difficulty, pain managed with PO meds, tolerating regular diet, and voiding without difficulty.   No fever/chills, chest pain, shortness of breath, nausea/vomiting, or leg pain. No nipple or breast pain.   Objective: BP 122/88 (BP Location: Left Arm)   Pulse 93   Temp 98.1 F (36.7 C) (Oral)   Resp 18   SpO2 100%   Breastfeeding Unknown    Physical Exam:  General: alert and cooperative Breasts: soft/nontender CV: RRR Pulm: nl effort Abdomen: soft, non-tender Uterine Fundus: firm Incision: n/a Perineum: minimal edema, intact Lochia: appropriate DVT Evaluation: No evidence of DVT seen on physical exam. Edinburgh:  Edinburgh Postnatal Depression Scale Screening Tool 01/21/2021  I have been able to laugh and see the funny side of things. 0  I have looked forward with enjoyment to things. 0  I have blamed myself unnecessarily when things went wrong. 1  I have been anxious or worried for no good reason. 2  I have felt scared or panicky for no good reason. 1  Things have been getting on top of me. 1  I have been so unhappy that I have had difficulty sleeping. 0  I have felt sad or miserable. 0  I have been so unhappy that I have been crying. 0  The thought of harming myself has occurred to me. 0  Edinburgh Postnatal Depression Scale Total 5     Recent Labs    01/21/21 0357 01/22/21 0634  HGB 9.9* 8.5*  HCT 30.0* 25.9*  WBC 10.2 12.2*  PLT 208 208    Assessment/Plan: 20 y.o. G1P0101 postpartum day # 1  -Continue routine postpartum care -Lactation consult PRN for breastfeeding   -Acute blood loss anemia - hemodynamically stable and asymptomatic; start PO ferrous sulfate BID with stool softeners    Disposition: Continue inpatient postpartum care    LOS: 1 day   Bethany Macdonald, CNM 01/22/2021, 11:46 AM

## 2021-01-22 NOTE — Discharge Instructions (Signed)

## 2021-01-22 NOTE — Lactation Note (Addendum)
This note was copied from a baby's chart. Lactation Consultation Note  Patient Name: Boy Renata Gambino CLEXN'T Date: 01/22/2021 Reason for consult: Follow-up assessment;Primapara;Late-preterm 34-36.6wks Age:20 hours  Maternal Data  Mom pumping when room entered, instructed to call LC at the next time baby goes to the breast Feeding Mother's Current Feeding Choice: Breast Milk and Formula  LATCH Score                    Lactation Tools Discussed/Used Breast pump type: Double-Electric Breast Pump Reason for Pumping: to stimulate milk production and provide supplement LC name and no written on white board Interventions Interventions: DEBP  Discharge Pump: DEBP WIC Program: Yes [facility] referral faxed to [place] WIC  Consult Status Consult Status: Follow-up Date: 01/22/21 Follow-up type: In-patient    Dyann Kief 01/22/2021, 1:12 PM

## 2021-01-23 ENCOUNTER — Encounter: Payer: Self-pay | Admitting: Anesthesiology

## 2021-01-23 LAB — CULTURE, BETA STREP (GROUP B ONLY)

## 2021-01-23 MED ORDER — DOCUSATE SODIUM 100 MG PO CAPS: 100.0000 mg | ORAL_CAPSULE | Freq: Two times a day (BID) | ORAL | 0 refills | Status: DC

## 2021-01-23 MED ORDER — MEDROXYPROGESTERONE ACETATE 150 MG/ML IM SUSP
150.0000 mg | INTRAMUSCULAR | Status: DC
  Administered 2021-01-23: 150 mg via INTRAMUSCULAR
  Filled 2021-01-23: qty 1

## 2021-01-23 MED ORDER — IBUPROFEN 600 MG PO TABS: 600.0000 mg | ORAL_TABLET | Freq: Four times a day (QID) | ORAL | 0 refills | Status: DC

## 2021-01-23 MED ORDER — WITCH HAZEL-GLYCERIN EX PADS: 1.0000 "application " | MEDICATED_PAD | CUTANEOUS | 12 refills | Status: DC | PRN

## 2021-01-23 MED ORDER — COCONUT OIL OIL: 1.0000 "application " | TOPICAL_OIL | 0 refills | Status: DC | PRN

## 2021-01-23 MED ORDER — BENZOCAINE-MENTHOL 20-0.5 % EX AERO: 1.0000 "application " | INHALATION_SPRAY | CUTANEOUS | Status: DC | PRN

## 2021-01-23 MED ORDER — ACETAMINOPHEN 325 MG PO TABS: 650.0000 mg | ORAL_TABLET | ORAL | Status: DC | PRN

## 2021-01-23 NOTE — Progress Notes (Signed)
Post Partum Day 2  Subjective: Doing well, no concerns. Ambulating without difficulty, pain managed with PO meds, tolerating regular diet, and voiding without difficulty.   No fever/chills, chest pain, shortness of breath, nausea/vomiting, or leg pain. No nipple or breast pain.   Objective: BP 110/78 (BP Location: Right Arm)   Pulse 85   Temp 98.3 F (36.8 C) (Oral)   Resp 18   SpO2 100%   Breastfeeding Unknown    Physical Exam:  General: alert and cooperative Breasts: soft/nontender CV: RRR Pulm: nl effort Abdomen: soft, non-tender Uterine Fundus: firm Incision: n/a Perineum: minimal edema, intact Lochia: appropriate DVT Evaluation: No evidence of DVT seen on physical exam.   Recent Labs    01/21/21 0357 01/22/21 0634  HGB 9.9* 8.5*  HCT 30.0* 25.9*  WBC 10.2 12.2*  PLT 208 208    Assessment/Plan: 20 y.o. G1P0101 postpartum day # 2  -Continue routine postpartum care -Lactation consult PRN for breastfeeding   -Acute blood loss anemia - hemodynamically stable and asymptomatic; start PO ferrous sulfate BID with stool softeners  -desires Depo for contraception.    Disposition: DC home   LOS: 2 days   Randa Ngo, CNM  01/23/2021, 10:29 AM

## 2021-01-23 NOTE — Plan of Care (Signed)
Discharge order from R. McVey CNM. Reviewed discharge orders and answered any questions. Patient d/c home with infant.

## 2022-02-08 ENCOUNTER — Ambulatory Visit: Payer: Medicaid Other | Admitting: Family Medicine

## 2022-02-08 ENCOUNTER — Encounter: Payer: Self-pay | Admitting: Family Medicine

## 2022-02-08 DIAGNOSIS — Z113 Encounter for screening for infections with a predominantly sexual mode of transmission: Secondary | ICD-10-CM

## 2022-02-08 DIAGNOSIS — B9689 Other specified bacterial agents as the cause of diseases classified elsewhere: Secondary | ICD-10-CM

## 2022-02-08 LAB — WET PREP FOR TRICH, YEAST, CLUE
Trichomonas Exam: NEGATIVE
Yeast Exam: NEGATIVE

## 2022-02-08 LAB — HM HIV SCREENING LAB: HM HIV Screening: NEGATIVE

## 2022-02-08 MED ORDER — METRONIDAZOLE 500 MG PO TABS: 500.0000 mg | ORAL_TABLET | Freq: Two times a day (BID) | ORAL | 0 refills | Status: AC

## 2022-02-08 NOTE — Progress Notes (Signed)
Pt here for STD screening.  Wet mount results reviewed.  The patient was dispensed Metronidazole 500 mg today. I provided counseling today regarding the medication. We discussed the medication, the side effects and when to call clinic. Patient given the opportunity to ask questions. Questions answered. Condoms declined.  Amiree No M Leila Schuff, RN   

## 2022-02-08 NOTE — Progress Notes (Signed)
[facility] Department  STI clinic/screening visit [address] [place] [address] [phone]  Subjective:  Bethany Macdonald is a 21 y.o. female being seen today for an STI screening visit. The patient reports they do not have symptoms.  Patient reports that they do not desire a pregnancy in the next year.   They reported they are not interested in discussing contraception today.    No LMP recorded.   Patient has the following medical conditions:   Patient Active Problem List   Diagnosis Date Noted   NSVD (normal spontaneous vaginal delivery) 01/21/2021    Chief Complaint  Patient presents with   SEXUALLY TRANSMITTED DISEASE    STI screening    HPI  Patient reports she is here for a STD screen.  She denies symptoms  Last HIV test per patient/review of record was unknown Patient reports last pap was never- < 47 yo   Screening for MPX risk: Does the patient have an unexplained rash? No Is the patient MSM? No Does the patient endorse multiple sex partners or anonymous sex partners? No Did the patient have close or sexual contact with a person diagnosed with MPX? No Has the patient traveled outside the [place] where MPX is endemic? No Is there a high clinical suspicion for MPX-- evidenced by one of the following No  -Unlikely to be chickenpox  -Lymphadenopathy  -Rash that present in same phase of evolution on any given body part See flowsheet for further details and programmatic requirements.   Immunization history:  Immunization History  Administered Date(s) Administered   HPV 9-valent 10/23/2014   Hepatitis A 08/02/2007, 04/24/2012   Hepatitis B 08-19[birth year]01, 04/23/2001, 10/03/2001   Hpv-Unspecified 04/24/2012, 02/21/2014   Tdap 12/31/2020     The following portions of the patient's history were reviewed and updated as appropriate: allergies, current medications, past medical history, past social history, past surgical history and problem  list.  Objective:  There were no vitals filed for this visit.  Physical Exam   Assessment and Plan:  Bethany Macdonald is a 21 y.o. female presenting to the [facility] Department for STI screening  1. Screening examination for venereal disease  Treat wet prep as per SO. - WET PREP FOR TRICH, YEAST, CLUE - Chlamydia/Gonorrhea [facility] - HIV [facility] - Syphilis Serology, [facility]  2. Bacterial vaginosis  - metroNIDAZOLE (FLAGYL) 500 MG tablet; Take 1 tablet (500 mg total) by mouth 2 (two) times daily for 7 days.  Dispense: 14 tablet; Refill: 0     Return if symptoms worsen or fail to improve.  No future appointments.  Larene Pickett, FNP

## 2022-02-18 ENCOUNTER — Telehealth: Payer: Self-pay

## 2022-02-18 NOTE — Telephone Encounter (Signed)
Calling pt regarding positive chlamydia result from 02/08/22 vaginal specimen.  Pt needs tx appt.

## 2022-02-21 NOTE — Telephone Encounter (Signed)
Phone call to first alternative number/pt contact, [phone]. Female answered phone and stated I have wrong number, hung up.  Phone call to second alternative number/pt contact, [phone]. Female answered phone and stated pt was not there right now and could she pass on a message. Left message requesting pt call her doctor's office.

## 2022-02-21 NOTE — Telephone Encounter (Signed)
Phone call to pt at [phone]. Received message that voicemail not set up.  Unable to leave message. Tried twice.  MyChart is pending.

## 2022-02-22 NOTE — Telephone Encounter (Signed)
Phone call to pt and pt confirmed password. Counseled pt regarding + CT result.  Pt states she is only available in the morning.  Tx appt scheduled for 02/23/22 (Siobhan, Genia Del on schedule).  Pt states NKA, Not using BC, Currently having normal period.

## 2022-02-23 ENCOUNTER — Ambulatory Visit: Payer: Medicaid Other

## 2022-02-23 DIAGNOSIS — A749 Chlamydial infection, unspecified: Secondary | ICD-10-CM

## 2022-02-23 MED ORDER — DOXYCYCLINE HYCLATE 100 MG PO TABS: 100.0000 mg | ORAL_TABLET | Freq: Two times a day (BID) | ORAL | 0 refills | Status: AC

## 2022-02-23 NOTE — Progress Notes (Signed)
In Nurse Clinic for chlamydia treatment. LMP 02/20/2022 (normal menses). On no birth control method. Last sex 01/10/2022.   Treated today per SO Dr Lubertha Sayres with doxycycline 100 mg #14 with instructions to take one capsule twice a day for 7 days.  RN dispensed doxy 100 mg #14. Instructions reviewed. Questions answered and verbalizes understanding. Advised to contact [facility] if vomits within 2 hrs of taking med.     Josie Saunders, RN

## 2022-06-03 ENCOUNTER — Ambulatory Visit (LOCAL_COMMUNITY_HEALTH_CENTER): Payer: Medicaid Other

## 2022-06-03 VITALS — BP 119/80 | Ht 62.0 in | Wt 101.0 lb

## 2022-06-03 DIAGNOSIS — Z3201 Encounter for pregnancy test, result positive: Secondary | ICD-10-CM | POA: Diagnosis not present

## 2022-06-03 LAB — PREGNANCY, URINE: Preg Test, Ur: POSITIVE — AB

## 2022-06-03 MED ORDER — PRENATAL 27-0.8 MG PO TABS: 1.0000 | ORAL_TABLET | Freq: Every day | ORAL | 0 refills | Status: AC

## 2022-06-03 NOTE — Progress Notes (Signed)
UPT positive. Plans prenatal care at [facility]. Sent to DSS for Medicaid/preg women.  The patient was dispensed prenatal vitamins today. I provided counseling today regarding the medication. We discussed the medication, the side effects and when to call clinic. Patient given the opportunity to ask questions. Questions answered.   Josie Saunders, RN

## 2022-06-25 ENCOUNTER — Emergency Department: Payer: Medicaid Other

## 2022-06-25 ENCOUNTER — Other Ambulatory Visit: Payer: Self-pay

## 2022-06-25 DIAGNOSIS — Z5321 Procedure and treatment not carried out due to patient leaving prior to being seen by health care provider: Secondary | ICD-10-CM | POA: Diagnosis not present

## 2022-06-25 DIAGNOSIS — O99419 Diseases of the circulatory system complicating pregnancy, unspecified trimester: Secondary | ICD-10-CM | POA: Insufficient documentation

## 2022-06-25 DIAGNOSIS — Z20822 Contact with and (suspected) exposure to covid-19: Secondary | ICD-10-CM | POA: Diagnosis not present

## 2022-06-25 DIAGNOSIS — R079 Chest pain, unspecified: Secondary | ICD-10-CM | POA: Diagnosis not present

## 2022-06-25 DIAGNOSIS — R0602 Shortness of breath: Secondary | ICD-10-CM | POA: Diagnosis not present

## 2022-06-25 LAB — URINALYSIS, ROUTINE W REFLEX MICROSCOPIC
Bilirubin Urine: NEGATIVE
Glucose, UA: NEGATIVE mg/dL
Hgb urine dipstick: NEGATIVE
Ketones, ur: NEGATIVE mg/dL
Nitrite: NEGATIVE
Protein, ur: NEGATIVE mg/dL
Specific Gravity, Urine: 1.019 (ref 1.005–1.030)
pH: 7 (ref 5.0–8.0)

## 2022-06-25 LAB — COMPREHENSIVE METABOLIC PANEL
ALT: 11 U/L (ref 0–44)
AST: 15 U/L (ref 15–41)
Albumin: 3.6 g/dL (ref 3.5–5.0)
Alkaline Phosphatase: 53 U/L (ref 38–126)
Anion gap: 5 (ref 5–15)
BUN: 10 mg/dL (ref 6–20)
CO2: 24 mmol/L (ref 22–32)
Calcium: 8.8 mg/dL — ABNORMAL LOW (ref 8.9–10.3)
Chloride: 106 mmol/L (ref 98–111)
Creatinine, Ser: 0.63 mg/dL (ref 0.44–1.00)
GFR, Estimated: >60 mL/min (ref >60)
Glucose, Bld: 95 mg/dL (ref 70–99)
Potassium: 3.6 mmol/L (ref 3.5–5.1)
Sodium: 135 mmol/L (ref 135–145)
Total Bilirubin: 0.6 mg/dL (ref 0.3–1.2)
Total Protein: 6.8 g/dL (ref 6.5–8.1)

## 2022-06-25 LAB — CBC
HCT: 32.8 % — ABNORMAL LOW (ref 36.0–46.0)
Hemoglobin: 11.1 g/dL — ABNORMAL LOW (ref 12.0–15.0)
MCH: 29.8 pg (ref 26.0–34.0)
MCHC: 33.8 g/dL (ref 30.0–36.0)
MCV: 87.9 fL (ref 80.0–100.0)
Platelets: 278 10*3/uL (ref 150–400)
RBC: 3.73 MIL/uL — ABNORMAL LOW (ref 3.87–5.11)
RDW: 12.5 % (ref 11.5–15.5)
WBC: 5.9 10*3/uL (ref 4.0–10.5)
nRBC: 0 % (ref 0.0–0.2)

## 2022-06-25 LAB — RESP PANEL BY RT-PCR (FLU A&B, COVID) ARPGX2
Influenza A by PCR: NEGATIVE
Influenza B by PCR: NEGATIVE
SARS Coronavirus 2 by RT PCR: NEGATIVE

## 2022-06-25 LAB — PREGNANCY, URINE: Preg Test, Ur: POSITIVE — AB

## 2022-06-25 LAB — HCG, QUANTITATIVE, PREGNANCY: hCG, Beta Chain, Quant, S: 242322 m[IU]/mL — ABNORMAL HIGH (ref <5)

## 2022-06-25 LAB — TROPONIN I (HIGH SENSITIVITY)
Troponin I (High Sensitivity): 3 ng/L (ref <18)
Troponin I (High Sensitivity): 3 ng/L (ref <18)

## 2022-06-25 NOTE — ED Triage Notes (Signed)
Pt to ED via POV from home. Pt reports she was at work and all of a sudden started having SOB and centralized CP that has been constant. Pt currently 8wks 5 days pregnant.

## 2022-06-25 NOTE — ED Provider Triage Note (Signed)
Emergency Medicine Provider Triage Evaluation Note  Bethany Macdonald , a 21 y.o. female  was evaluated in triage.  Pt complains of cp and intermittent shortness of breath. CP began today. Patient is [redacted] weeks pregnant. No pregnancy complaints. No URI symptoms, no emesis. .  Review of Systems  Positive: CP intermittent shob Negative: Vaginal bleeding, fever, chills, cough  Physical Exam  LMP 04/25/2022 (Exact Date)  Gen:   Awake, no distress   Resp:  Normal effort  MSK:   Moves extremities without difficulty  Other:    Medical Decision Making  Medically screening exam initiated at 5:21 PM.  Appropriate orders placed.  Bethany Macdonald was informed that the remainder of the evaluation will be completed by another provider, this initial triage assessment does not replace that evaluation, and the importance of remaining in the ED until their evaluation is complete.  CP and intermittent shob. Labs, ekg, chest xray, urinalysis, covid swab   Darletta Moll, PA-C 06/25/22 1724

## 2022-06-26 ENCOUNTER — Emergency Department
Admission: EM | Admit: 2022-06-26 | Discharge: 2022-06-26 | Discharge status: Against Medical Advice | Payer: Medicaid Other | Attending: Emergency Medicine | Admitting: Emergency Medicine

## 2022-07-12 DIAGNOSIS — Z8751 Personal history of pre-term labor: Secondary | ICD-10-CM

## 2022-07-13 DIAGNOSIS — Z8619 Personal history of other infectious and parasitic diseases: Secondary | ICD-10-CM | POA: Insufficient documentation

## 2022-10-12 ENCOUNTER — Emergency Department
Admission: EM | Admit: 2022-10-12 | Discharge: 2022-10-12 | Disposition: A | Discharge status: Home / Self Care | Payer: BC Managed Care – PPO | Attending: Emergency Medicine | Admitting: Emergency Medicine

## 2022-10-12 ENCOUNTER — Other Ambulatory Visit: Payer: Self-pay

## 2022-10-12 DIAGNOSIS — U071 COVID-19: Secondary | ICD-10-CM | POA: Diagnosis not present

## 2022-10-12 DIAGNOSIS — O98512 Other viral diseases complicating pregnancy, second trimester: Secondary | ICD-10-CM | POA: Diagnosis not present

## 2022-10-12 DIAGNOSIS — O99891 Other specified diseases and conditions complicating pregnancy: Secondary | ICD-10-CM | POA: Diagnosis present

## 2022-10-12 LAB — RESP PANEL BY RT-PCR (RSV, FLU A&B, COVID)  RVPGX2
Influenza A by PCR: NEGATIVE
Influenza B by PCR: NEGATIVE
Resp Syncytial Virus by PCR: NEGATIVE
SARS Coronavirus 2 by RT PCR: POSITIVE — AB

## 2022-10-12 MED ORDER — ALBUTEROL SULFATE HFA 108 (90 BASE) MCG/ACT IN AERS: 2.0000 | INHALATION_SPRAY | Freq: Four times a day (QID) | RESPIRATORY_TRACT | 2 refills | Status: DC | PRN

## 2022-10-12 MED ORDER — FLUTICASONE PROPIONATE 50 MCG/ACT NA SUSP: 1.0000 | Freq: Every day | NASAL | 0 refills | Status: DC

## 2022-10-12 NOTE — ED Provider Notes (Signed)
[facility] Provider Note    Event Date/Time   First MD Initiated Contact with Patient 10/12/22 1943     (approximate)   History   Chief Complaint Cough   HPI Bethany Macdonald is a 22 y.o. female, history of anemia, currently 6 months pregnant, presents to the emergency department for evaluation of COVID-like symptoms.  She reports cough, congestion, fever/chills, and mild chest tightness/shortness of breath x 3 days.  Denies abdominal pain, flank pain, vomiting, diarrhea, urinary symptoms, weakness, dizziness/lightheadedness, or rash/lesions.  History Limitations: No limitations.        Physical Exam  Triage Vital Signs: ED Triage Vitals [10/12/22 1833]  Enc Vitals Group     BP 103/86     Pulse Rate 80     Resp 18     Temp 97.8 F (36.6 C)     Temp src      SpO2 99 %     Weight      Height      Head Circumference      Peak Flow      Pain Score 5     Pain Loc      Pain Edu?      Excl. in [place]?     Most recent vital signs: Vitals:   10/12/22 1833  BP: 103/86  Pulse: 80  Resp: 18  Temp: 97.8 F (36.6 C)  SpO2: 99%    General: Awake, NAD.  Very pleasant. Skin: Warm, dry. No rashes or lesions.  Eyes: PERRL. Conjunctivae normal.  CV: Good peripheral perfusion.  Resp: Normal effort.  Lung sounds are clear bilaterally in the apices and bases. Abd: Soft, non-tender. No distention.  Neuro: At baseline. No gross neurological deficits.  Musculoskeletal: Normal ROM of all extremities.  Physical Exam    ED Results / Procedures / Treatments  Labs (all labs ordered are listed, but only abnormal results are displayed) Labs Reviewed  RESP PANEL BY RT-PCR (RSV, FLU A&B, COVID)  [id number] - Abnormal; Notable for the following components:      Result Value   SARS Coronavirus 2 by RT PCR POSITIVE (*)    All other components within normal limits     EKG N/A.    RADIOLOGY  ED Provider Interpretation: N/A.  No results  found.  PROCEDURES:  Critical Care performed: N/A.  Procedures    MEDICATIONS ORDERED IN ED: Medications - No data to display   IMPRESSION / MDM / [place] / ED COURSE  I reviewed the triage vital signs and the nursing notes.                              Differential diagnosis includes, but is not limited to, COVID-19, influenza, RSV, community-acquired pneumonia, bronchitis.   Assessment/Plan Presentation consistent with COVID-19 infection, confirmed by PCR.  She appears well clinically.  Vitals within normal limits.  Lung sounds are clear to auscultation bilaterally.  Very low suspicion for underlying pneumonia.  I do not believe imaging is indicated at this time.  She is overall healthy and does not have any significant comorbidities.  I did offer her Paxlovid given her pregnancy status explained the risks benefits.  She has agreed to forego Paxlovid at this time.  However, we will treat symptomatically.  Provided her with a prescription for Flonase and albuterol inhaler to help treat her symptoms.  Encouraged her to continue taking Tylenol as needed.  Recommend that she follow with her primary care provider as needed.  Will discharge.  Provided the patient with anticipatory guidance, return precautions, and educational material. Encouraged the patient to return to the emergency department at any time if they begin to experience any new or worsening symptoms. Patient expressed understanding and agreed with the plan.   Patient's presentation is most consistent with acute complicated illness / injury requiring diagnostic workup.       FINAL CLINICAL IMPRESSION(S) / ED DIAGNOSES   Final diagnoses:  [id number]     Rx / DC Orders   ED Discharge Orders          Ordered    albuterol (VENTOLIN HFA) 108 (90 Base) MCG/ACT inhaler  Every 6 hours PRN        10/12/22 2040    fluticasone (FLONASE) 50 MCG/ACT nasal spray  Daily        10/12/22 2040              Note:  This document was prepared using Dragon voice recognition software and may include unintentional dictation errors.   Teodoro Spray, [place] 10/12/22 2046    Blake Divine, MD 10/12/22 [id number]

## 2022-10-12 NOTE — Discharge Instructions (Addendum)
-  You tested positive for COVID-19 today.  Fortunately, there does not appear to be any signs of pneumonia.  I suspect that you will recover within the next few days.  You may continue taking Tylenol as needed.  For the sinus congestion, you may take fluticasone as needed.  For the chest tightness/shortness of breath, you may utilize the albuterol inhaler as needed.  -Follow-up with your primary care provider as needed.  -Return to the emergency department anytime if you begin to experience any new or worsening symptoms.

## 2022-10-12 NOTE — ED Triage Notes (Addendum)
Pt comes with c/o cough congestion and some heat flashes. Pt stats some trouble taking breath. Pt stats this all started few days ago.   Pt states she is 6 months pregnant. Pt denies any issues with baby or pelvic pain. Pt does states some nausea that is from pregnancy .  [id number]

## 2022-11-24 ENCOUNTER — Encounter: Payer: Self-pay | Admitting: Obstetrics and Gynecology

## 2022-11-24 ENCOUNTER — Observation Stay
Admission: EM | Admit: 2022-11-24 | Discharge: 2022-11-24 | Disposition: A | Discharge status: Home / Self Care | Payer: BC Managed Care – PPO | Attending: Obstetrics and Gynecology | Admitting: Obstetrics and Gynecology

## 2022-11-24 ENCOUNTER — Other Ambulatory Visit: Payer: Self-pay

## 2022-11-24 DIAGNOSIS — R1032 Left lower quadrant pain: Secondary | ICD-10-CM | POA: Diagnosis not present

## 2022-11-24 DIAGNOSIS — Z3A30 30 weeks gestation of pregnancy: Secondary | ICD-10-CM | POA: Diagnosis not present

## 2022-11-24 DIAGNOSIS — O26893 Other specified pregnancy related conditions, third trimester: Secondary | ICD-10-CM | POA: Diagnosis present

## 2022-11-24 DIAGNOSIS — O26899 Other specified pregnancy related conditions, unspecified trimester: Secondary | ICD-10-CM | POA: Diagnosis present

## 2022-11-24 LAB — URINALYSIS, ROUTINE W REFLEX MICROSCOPIC
Bilirubin Urine: NEGATIVE
Glucose, UA: NEGATIVE mg/dL
Hgb urine dipstick: NEGATIVE
Ketones, ur: NEGATIVE mg/dL
Nitrite: NEGATIVE
Protein, ur: NEGATIVE mg/dL
Specific Gravity, Urine: 1.021 (ref 1.005–1.030)
pH: 7 (ref 5.0–8.0)

## 2022-11-24 LAB — WET PREP, GENITAL
Clue Cells Wet Prep HPF POC: NONE SEEN
Sperm: NONE SEEN
Trich, Wet Prep: NONE SEEN
WBC, Wet Prep HPF POC: >=10 — AB (ref <10)
Yeast Wet Prep HPF POC: NONE SEEN

## 2022-11-24 LAB — CHLAMYDIA/NGC RT PCR (ARMC ONLY)
Chlamydia Tr: NOT DETECTED
N gonorrhoeae: NOT DETECTED

## 2022-11-24 LAB — URINE DRUG SCREEN, QUALITATIVE (ARMC ONLY)
Amphetamines, Ur Screen: NOT DETECTED
Barbiturates, Ur Screen: NOT DETECTED
Benzodiazepine, Ur Scrn: NOT DETECTED
Cannabinoid 50 Ng, Ur ~~LOC~~: NOT DETECTED
Cocaine Metabolite,Ur ~~LOC~~: NOT DETECTED
MDMA (Ecstasy)Ur Screen: NOT DETECTED
Methadone Scn, Ur: NOT DETECTED
Opiate, Ur Screen: NOT DETECTED
Phencyclidine (PCP) Ur S: NOT DETECTED
Tricyclic, Ur Screen: NOT DETECTED

## 2022-11-24 MED ORDER — METRONIDAZOLE 500 MG PO TABS: 500.0000 mg | ORAL_TABLET | Freq: Two times a day (BID) | ORAL | 0 refills | Status: DC

## 2022-11-24 MED ORDER — ACETAMINOPHEN 325 MG PO TABS: 650.0000 mg | ORAL_TABLET | ORAL | Status: DC | PRN

## 2022-11-24 MED ORDER — METRONIDAZOLE 500 MG PO TABS: 500.0000 mg | ORAL_TABLET | Freq: Two times a day (BID) | ORAL | Status: DC

## 2022-11-24 MED ORDER — METRONIDAZOLE 500 MG PO TABS
500.0000 mg | ORAL_TABLET | Freq: Two times a day (BID) | ORAL | Status: DC
  Administered 2022-11-24: 500 mg via ORAL
  Filled 2022-11-24: qty 1

## 2022-11-24 MED ORDER — METRONIDAZOLE 500 MG PO TABS: 500.0000 mg | ORAL_TABLET | Freq: Two times a day (BID) | ORAL | 0 refills | Status: AC

## 2022-11-24 MED ORDER — TERBUTALINE SULFATE 1 MG/ML IJ SOLN
0.2500 mg | Freq: Once | INTRAMUSCULAR | Status: AC
  Administered 2022-11-24: 0.25 mg via SUBCUTANEOUS
  Filled 2022-11-24: qty 1

## 2022-11-24 NOTE — Progress Notes (Signed)
Pt discharged by this RN. Pt ambulatory upon time of discharge with steady gait and NAD. Pt verbalized understanding pf warning signs of pre-term labor.

## 2022-11-24 NOTE — Discharge Summary (Signed)
Bethany Macdonald is a 22 y.o. female. She is at 9w3dgestation. Patient's last menstrual period was 04/25/2022 (exact date). Estimated Date of Delivery: 01/30/23  [facility] Current pregnancy complicated by:  Prior Preterm birth at 353wksIron deficiency anemia Chlamydia x 2 this pregnancy, Neg TOC 10/2022  Chief complaint: sharp left side abdominal pain that radiates down to groin. Denies VB or LOF; active FM noted.    S: Resting comfortably. no CTX, no VB.no LOF,  Active fetal movement. Denies: HA, visual changes, SOB, or RUQ/epigastric pain  Maternal Medical History:   Past Medical History:  Diagnosis Date   Anemia     Past Surgical History:  Procedure Laterality Date   NO PAST SURGERIES      Allergies  Allergen Reactions   Bee Venom     Prior to Admission medications   Medication Sig Start Date End Date Taking? Authorizing Provider  albuterol (VENTOLIN HFA) 108 (90 Base) MCG/ACT inhaler Inhale 2 puffs into the lungs every 6 (six) hours as needed for wheezing or shortness of breath. 10/12/22  Yes STeodoro Spray PA  Prenatal Vit-Fe Fumarate-FA (PRENATAL MULTIVITAMIN) TABS tablet Take 1 tablet by mouth daily at 12 noon.   Yes [provider]  acetaminophen (TYLENOL) 325 MG tablet Take 2 tablets (650 mg total) by mouth every 4 (four) hours as needed (for pain scale < 4). Patient not taking: Reported on 06/03/2022 01/23/21   Antwone Capozzoli, RMurray Hodgkins CNM  benzocaine-Menthol (DERMOPLAST) 20-0.5 % AERO Apply 1 application topically as needed for irritation (perineal discomfort). Patient not taking: Reported on 02/23/2022 01/23/21   Kemesha Mosey, RWells GuilesA, CNM  coconut oil OIL Apply 1 application topically as needed. Patient not taking: Reported on 02/23/2022 01/23/21   Una Yeomans, RMurray Hodgkins CNM  docusate sodium (COLACE) 100 MG capsule Take 1 capsule (100 mg total) by mouth 2 (two) times daily. Patient not taking: Reported on 02/23/2022 01/23/21   Ayriana Wix, RMurray Hodgkins CNM   ferrous sulfate 325 (65 FE) MG EC tablet Take 325 mg by mouth 3 (three) times daily with meals. Patient not taking: Reported on 02/23/2022    [provider]  fluticasone (FLONASE) 50 MCG/ACT nasal spray Place 1 spray into both nostrils daily for 10 days. 10/12/22 10/22/22  STeodoro Spray PA  ibuprofen (ADVIL) 600 MG tablet Take 1 tablet (600 mg total) by mouth every 6 (six) hours. Patient not taking: Reported on 02/23/2022 01/23/21   Chanette Demo, RMurray Hodgkins CNM  witch hazel-glycerin (TUCKS) pad Apply 1 application topically as needed for hemorrhoids. Patient not taking: Reported on 02/23/2022 01/23/21   Prisilla Kocsis, RMurray Hodgkins CNM      Social History: She  reports that she has never smoked. She has never used smokeless tobacco. She reports that she does not currently use alcohol. She reports that she does not use drugs.  Family History: no history of gyn cancers  Review of Systems: A full review of systems was performed and negative except as noted in the HPI.     O:  BP 92/61 (BP Location: Right Arm)   Pulse (!) 102   Temp 97.7 F (36.5 C) (Oral)   Resp 14   Ht '5\' 2"'$  (1.575 m)   LMP 04/25/2022 (Exact Date)   BMI 18.47 kg/m  No results found for this or any previous visit (from the past 467hour(s)).   Constitutional: NAD, AAOx3  HE/ENT: extraocular movements grossly intact, moist mucous membranes CV: RRR PULM: nl respiratory effort, CTABL  Abd: gravid, non-tender, non-distended, soft      Ext: Non-tender, Nonedematous   Psych: mood appropriate, speech normal Pelvic: SSE: erythema of vaginal walls, adherent white DC noted. NO vaginal bleeding, cervix is closed.  SVE: closed/long.   Fetal  monitoring: Cat I Appropriate for GA Baseline: 140 Variability: moderate Accelerations: present x >2 Decelerations absent  TOCO: UCs resolved after terb given.     A/P: 22 y.o. 45w3dhere for antenatal surveillance for abdominal pain  Principle Diagnosis:  Ligament pain, BV,  30wks  Preterm labor: not present.  Fetal Wellbeing: Reassuring Cat 1 tracing; Reactive NST  Wet prep: + WBCs, exam c/w BV; Rx Flagyl to pharmacy Pending GC/CT UA: neg D/c home stable, precautions reviewed, follow-up as scheduled.    RFrancetta Found CNM 11/24/2022  4:54 PM

## 2022-11-24 NOTE — OB Triage Note (Signed)
Pt is a 21yo G2P0101, 30w 3d. She arrived to the unit with complaints of  severe left abdominal pain that she is describing as constant and sharp. This started today at 1100 - she has not taken and pain medication and is rating the pain 8/10.   She denies vaginal bleeding, reports positive fetal movement, and report irregular contractions that have been present for the past 3 days.  VS stable, monitors applied and assessing.   Initial FHT 135 at 1642.  CNM aware of pt arrival and en route.

## 2022-11-26 LAB — URINE CULTURE: Culture: NO GROWTH

## 2022-12-12 ENCOUNTER — Inpatient Hospital Stay
Admission: EM | Admit: 2022-12-12 | Discharge: 2022-12-15 | DRG: 833 | Disposition: A | Discharge status: Home / Self Care | Payer: BC Managed Care – PPO | Attending: Obstetrics | Admitting: Obstetrics

## 2022-12-12 ENCOUNTER — Encounter: Payer: Self-pay | Admitting: Obstetrics and Gynecology

## 2022-12-12 ENCOUNTER — Other Ambulatory Visit: Payer: Self-pay

## 2022-12-12 DIAGNOSIS — D509 Iron deficiency anemia, unspecified: Secondary | ICD-10-CM | POA: Diagnosis present

## 2022-12-12 DIAGNOSIS — O368330 Maternal care for abnormalities of the fetal heart rate or rhythm, third trimester, not applicable or unspecified: Secondary | ICD-10-CM | POA: Diagnosis present

## 2022-12-12 DIAGNOSIS — O4703 False labor before 37 completed weeks of gestation, third trimester: Principal | ICD-10-CM | POA: Diagnosis present

## 2022-12-12 DIAGNOSIS — O99013 Anemia complicating pregnancy, third trimester: Secondary | ICD-10-CM | POA: Diagnosis present

## 2022-12-12 DIAGNOSIS — Z3A33 33 weeks gestation of pregnancy: Secondary | ICD-10-CM

## 2022-12-12 LAB — URINALYSIS, ROUTINE W REFLEX MICROSCOPIC
Bacteria, UA: NONE SEEN
Bilirubin Urine: NEGATIVE
Glucose, UA: NEGATIVE mg/dL
Hgb urine dipstick: NEGATIVE
Ketones, ur: NEGATIVE mg/dL
Nitrite: NEGATIVE
Protein, ur: NEGATIVE mg/dL
Specific Gravity, Urine: 1.013 (ref 1.005–1.030)
pH: 7 (ref 5.0–8.0)

## 2022-12-12 LAB — TYPE AND SCREEN
ABO/RH(D): O POS
Antibody Screen: NEGATIVE

## 2022-12-12 LAB — CBC
HCT: 27.4 % — ABNORMAL LOW (ref 36.0–46.0)
Hemoglobin: 9.2 g/dL — ABNORMAL LOW (ref 12.0–15.0)
MCH: 28.5 pg (ref 26.0–34.0)
MCHC: 33.6 g/dL (ref 30.0–36.0)
MCV: 84.8 fL (ref 80.0–100.0)
Platelets: 163 10*3/uL (ref 150–400)
RBC: 3.23 MIL/uL — ABNORMAL LOW (ref 3.87–5.11)
RDW: 12.4 % (ref 11.5–15.5)
WBC: 7.6 10*3/uL (ref 4.0–10.5)
nRBC: 0 % (ref 0.0–0.2)

## 2022-12-12 LAB — WET PREP, GENITAL
Clue Cells Wet Prep HPF POC: NONE SEEN
Trich, Wet Prep: NONE SEEN
WBC, Wet Prep HPF POC: <10 (ref <10)
Yeast Wet Prep HPF POC: NONE SEEN

## 2022-12-12 LAB — GROUP B STREP BY PCR: Group B strep by PCR: NEGATIVE

## 2022-12-12 LAB — RPR: RPR Ser Ql: NONREACTIVE

## 2022-12-12 MED ORDER — LACTATED RINGERS IV SOLN: INTRAVENOUS | Status: DC

## 2022-12-12 MED ORDER — OXYTOCIN BOLUS FROM INFUSION: 333.0000 mL | Freq: Once | INTRAVENOUS | Status: DC

## 2022-12-12 MED ORDER — MAGNESIUM SULFATE 40 GM/1000ML IV SOLN
1.0000 g/h | INTRAVENOUS | Status: DC
  Filled 2022-12-12: qty 1000

## 2022-12-12 MED ORDER — AMMONIA AROMATIC IN INHA
RESPIRATORY_TRACT | Status: AC
  Filled 2022-12-12: qty 10

## 2022-12-12 MED ORDER — OXYTOCIN-SODIUM CHLORIDE 30-0.9 UT/500ML-% IV SOLN: 2.5000 [IU]/h | INTRAVENOUS | Status: DC

## 2022-12-12 MED ORDER — MISOPROSTOL 200 MCG PO TABS
ORAL_TABLET | ORAL | Status: AC
  Filled 2022-12-12: qty 4

## 2022-12-12 MED ORDER — CALCIUM GLUCONATE 10 % IV SOLN
INTRAVENOUS | Status: AC
  Filled 2022-12-12: qty 10

## 2022-12-12 MED ORDER — LIDOCAINE HCL (PF) 1 % IJ SOLN
INTRAMUSCULAR | Status: AC
  Filled 2022-12-12: qty 30

## 2022-12-12 MED ORDER — FENTANYL CITRATE (PF) 100 MCG/2ML IJ SOLN: 50.0000 ug | INTRAMUSCULAR | Status: DC | PRN

## 2022-12-12 MED ORDER — SOD CITRATE-CITRIC ACID 500-334 MG/5ML PO SOLN: 30.0000 mL | ORAL | Status: DC | PRN

## 2022-12-12 MED ORDER — TERBUTALINE SULFATE 1 MG/ML IJ SOLN
0.2500 mg | Freq: Once | INTRAMUSCULAR | Status: AC
  Administered 2022-12-12: 0.25 mg via SUBCUTANEOUS
  Filled 2022-12-12: qty 1

## 2022-12-12 MED ORDER — LACTATED RINGERS IV BOLUS
1000.0000 mL | Freq: Once | INTRAVENOUS | Status: AC
  Administered 2022-12-12: 1000 mL via INTRAVENOUS

## 2022-12-12 MED ORDER — OXYTOCIN-SODIUM CHLORIDE 30-0.9 UT/500ML-% IV SOLN
INTRAVENOUS | Status: AC
  Filled 2022-12-12: qty 500

## 2022-12-12 MED ORDER — LACTATED RINGERS IV SOLN: 500.0000 mL | INTRAVENOUS | Status: DC | PRN

## 2022-12-12 MED ORDER — BETAMETHASONE SOD PHOS & ACET 6 (3-3) MG/ML IJ SUSP
12.0000 mg | Freq: Once | INTRAMUSCULAR | Status: AC
  Administered 2022-12-12: 12 mg via INTRAMUSCULAR
  Filled 2022-12-12: qty 5

## 2022-12-12 MED ORDER — ACETAMINOPHEN 325 MG PO TABS
650.0000 mg | ORAL_TABLET | ORAL | Status: DC | PRN
  Administered 2022-12-12: 650 mg via ORAL
  Filled 2022-12-12: qty 2

## 2022-12-12 MED ORDER — OXYTOCIN 10 UNIT/ML IJ SOLN
INTRAMUSCULAR | Status: AC
  Filled 2022-12-12: qty 2

## 2022-12-12 MED ORDER — ONDANSETRON HCL 4 MG/2ML IJ SOLN: 4.0000 mg | Freq: Four times a day (QID) | INTRAMUSCULAR | Status: DC | PRN

## 2022-12-12 MED ORDER — LIDOCAINE HCL (PF) 1 % IJ SOLN: 30.0000 mL | INTRAMUSCULAR | Status: DC | PRN

## 2022-12-12 MED ORDER — MAGNESIUM SULFATE BOLUS VIA INFUSION
4.0000 g | Freq: Once | INTRAVENOUS | Status: AC
  Administered 2022-12-12: 4 g via INTRAVENOUS
  Filled 2022-12-12: qty 1000

## 2022-12-12 NOTE — Consult Note (Signed)
[facility] [place]  Prenatal Consult       12/12/2022  3:44 PM   I was asked by Lucrezia Europe, CNM to consult on this patient for possible/anticipated 33 week preterm delivery. I had the pleasure of meeting with Nathen May today. She is a 22 yo female with h/o preterm delivery. Pregnancy complicated by Prior Preterm birth at 35wks, Iron deficiency anemia, Chlamydia x 2 this pregnancy. She and her partner is/are expecting a baby boy, to be named Hydrologist.   I explained that the neonatal intensive care team would be present for the delivery and outlined the likely delivery room course for this baby including routine resuscitation and NRP-guided approaches to the treatment of respiratory distress. We discussed other common problems associated with prematurity including respiratory distress syndrome/CLD, apnea, feeding issues, temperature regulation, and infection risk. We briefly discussed IVH/PVL, ROP, and NEC and that these are complications associated with prematurity, but that by 30 weeks are uncommon.   We discussed the average length of stay but I noted that the actual LOS would depend on the severity of problems encountered and response to treatments. We discussed visitation policies and the resources available while her child is in the hospital.  We discussed the importance of good nutrition and various methods of providing nutrition (parenteral hyperalimentation, gavage feedings and/or oral feeding). We discussed the benefits of human milk. I encouraged breast feeding and pumping soon after birth and outlined resources that are available to support breast feeding. We discussed the possibility of using donor breast milk as a bridge to maternal breast milk.  Thank you for involving [place] in the care of this patient. A member of our team will be available should the family have additional questions.  Time for consultation: approximately 20 minutes of face-to-face time in  discussion of the risks and medical care associated with preterm delivery.   Davonna Belling, MD Attending Neonatologist

## 2022-12-12 NOTE — H&P (Signed)
OB History & Physical   History of Present Illness:   Chief Complaint: regular uterine contractions  HPI:  Kristopher Hasse is a 22 y.o. G37P0101 female at [redacted]w[redacted]d, Patient's last menstrual period was 04/25/2022 (exact date). with Estimated Date of Delivery: 01/30/23.  She presents to L&D for regular uterine contractions about 4 mins apart  Reports active fetal movement  Contractions: every 3 to 4 minutes LOF/SROM: intact Vaginal bleeding: none  Factors complicating pregnancy:  Prior Preterm birth at 35wks Iron deficiency anemia Chlamydia x 2 this pregnancy, Neg TOC 10/2022  Patient Active Problem List   Diagnosis Date Noted   Preterm uterine contractions in third trimester, antepartum 12/12/2022   Abdominal pain during pregnancy in third trimester 11/24/2022   NSVD (normal spontaneous vaginal delivery) 01/21/2021    Prenatal Transfer Tool  Maternal Diabetes: No Genetic Screening: Normal Maternal Ultrasounds/Referrals: Normal Fetal Ultrasounds or other Referrals:  None Maternal Substance Abuse:  No Significant Maternal Medications:  None Significant Maternal Lab Results: Other:   Maternal Medical History:   Past Medical History:  Diagnosis Date   Anemia     Past Surgical History:  Procedure Laterality Date   NO PAST SURGERIES      Allergies  Allergen Reactions   Bee Venom     Prior to Admission medications   Medication Sig Start Date End Date Taking? Authorizing Provider  acetaminophen (TYLENOL) 325 MG tablet Take 2 tablets (650 mg total) by mouth every 4 (four) hours as needed (for pain scale < 4). Patient not taking: Reported on 06/03/2022 01/23/21   McVey, Murray Hodgkins, CNM  albuterol (VENTOLIN HFA) 108 (90 Base) MCG/ACT inhaler Inhale 2 puffs into the lungs every 6 (six) hours as needed for wheezing or shortness of breath. 10/12/22   Teodoro Spray, PA  docusate sodium (COLACE) 100 MG capsule Take 1 capsule (100 mg total) by mouth 2 (two) times daily. Patient  not taking: Reported on 02/23/2022 01/23/21   McVey, Murray Hodgkins, CNM  ferrous sulfate 325 (65 FE) MG EC tablet Take 325 mg by mouth 3 (three) times daily with meals. Patient not taking: Reported on 02/23/2022    [provider]  fluticasone (FLONASE) 50 MCG/ACT nasal spray Place 1 spray into both nostrils daily for 10 days. 10/12/22 10/22/22  Teodoro Spray, PA  Prenatal Vit-Fe Fumarate-FA (PRENATAL MULTIVITAMIN) TABS tablet Take 1 tablet by mouth daily at 12 noon.    [provider]     Prenatal care site:  [facility]  OB History  Gravida Para Term Preterm AB Living  2 1 0 1 0 1  SAB IAB Ectopic Multiple Live Births  0 0 0 0 1    # Outcome Date GA Lbr Len/2nd Weight Sex Delivery Anes PTL Lv  2 Current           1 Preterm 01/21/21 [redacted]w[redacted]d 07:01 / 00:33 2250 g M Vag-Spont EPI  LIV     Name: Struthers,BOY Camarie     Apgar1: 9  Apgar5: 9     Social History: She  reports that she has never smoked. She has never used smokeless tobacco. She reports that she does not currently use alcohol. She reports that she does not use drugs.  Family History: family history is not on file.   Review of Systems: A full review of systems was performed and negative except as noted in the HPI.     Physical Exam:  Vital Signs: BP 99/61   Pulse (!) 112  Resp 17   Ht 5\' 2"  (1.575 m)   Wt 50.8 kg   LMP 04/25/2022 (Exact Date)   BMI 20.49 kg/m   General: no acute distress.  HEENT: normocephalic, atraumatic Heart: regular rate & rhythm Lungs: normal respiratory effort Abdomen: soft, gravid, non-tender;  Pelvic:   External: Normal external female genitalia  Cervix: Dilation: 3 / Effacement (%): 80 / Station: -1    Extremities: non-tender, symmetric, no edema bilaterally.   Neurologic: Alert & oriented x 3.    Results for orders placed or performed during the hospital encounter of 12/12/22 (from the past 24 hour(s))  CBC     Status: Abnormal   Collection Time: 12/12/22  6:15 AM  Result  Value Ref Range   WBC 7.6 4.0 - 10.5 K/uL   RBC 3.23 (L) 3.87 - 5.11 MIL/uL   Hemoglobin 9.2 (L) 12.0 - 15.0 g/dL   HCT 27.4 (L) 36.0 - 46.0 %   MCV 84.8 80.0 - 100.0 fL   MCH 28.5 26.0 - 34.0 pg   MCHC 33.6 30.0 - 36.0 g/dL   RDW 12.4 11.5 - 15.5 %   Platelets 163 150 - 400 K/uL   nRBC 0.0 0.0 - 0.2 %  Type and screen [place]     Status: None (Preliminary result)   Collection Time: 12/12/22  6:15 AM  Result Value Ref Range   ABO/RH(D) PENDING    Antibody Screen PENDING    Sample Expiration      12/15/2022,2359 Performed at [facility], [address] [place], [place] [address]   Wet prep, genital     Status: None   Collection Time: 12/12/22  6:15 AM  Result Value Ref Range   Yeast Wet Prep HPF POC NONE SEEN NONE SEEN   Trich, Wet Prep NONE SEEN NONE SEEN   Clue Cells Wet Prep HPF POC NONE SEEN NONE SEEN   WBC, Wet Prep HPF POC <10 <10   Sperm PRESENT     Pertinent Results:  Prenatal Labs: Blood type/Rh PENDING   Antibody screen Negative    Rubella immune    Varicella Immune  RPR NR    HBsAg Neg   Hep C NR   HIV NR    GC neg  Chlamydia neg  Genetic screening cfDNA negative   1 hour GTT 92  3 hour GTT N/a  GBS pending     FHT:  FHR: 140 bpm, variability: moderate,  accelerations:  Present,  decelerations:  Present mild variables appropriate for gestational age Category/reactivity:  Category II UC:   regular, every 3-4 minutes   Cephalic by [place] and SVE   No results found.  Assessment:  Cindi Goulden is a 22 y.o. G62P0101 female at [redacted]w[redacted]d with hx of preterm delivery@ 35 weeks and anemia.   Plan:  1. Admit to Labor & Delivery - consents reviewed and obtained - Dr. Ouida Sills notified of admission and plan of care   2. Fetal Well being  - Fetal Tracing: category 2 - Group B Streptococcus ppx   GBS unknown - Presentation: Cephalic confirmed by SVE   3. Routine OB: - Prenatal labs reviewed, as above - Rh  positive - CBC, T&S, RPR on admit - Clear liquid diet , continuous IV fluids  4. Monitoring of labor  - Contractions monitored with external toco - Pelvis proven to 2250g - Plan for expectant management  - Plan for  continuous fetal monitoring - Maternal pain control as desired; planning  regional anesthesia and IVPM -Terbutaline given -Betamethasone given x1 @ [phone] -Magnesium sulfate 4 g bolus, 1 gm/hr - Anticipate vaginal delivery  5. Post Partum Planning: - Infant feeding: formula feeding - Contraception: Depo-Provera - Tdap vaccine:  declined - Flu vaccine:  declined  Lemmie Vanlanen Marlaine Hind, CNM [id number] AM  Avelino Leeds, CNM Certified Nurse Midwife [facility]

## 2022-12-12 NOTE — OB Triage Note (Signed)
Patient is 33wk G2P1, is seen primarily at [place]. Is plnning to deliver there but lives closer to [facility]. Pt has hx of preterm labor and is c/o CTX today since 0130am . Denies fluid leaking. [id number]. Last seen in office last Friday and has next appt on 12/23/22

## 2022-12-12 NOTE — Progress Notes (Signed)
Labor Progress Note  Bethany Macdonald is a 22 y.o. G2P0101 at [redacted]w[redacted]d by LMP admitted for preterm labor  Subjective: she reports her contractions are as strong as they were this morning, but less frequent  Objective: BP (!) 99/58   Pulse (!) 110   Temp 98.6 F (37 C) (Oral)   Resp 16   Ht 5\' 2"  (1.575 m)   Wt 50.8 kg   LMP 04/25/2022 (Exact Date)   SpO2 97%   BMI 20.49 kg/m  Notable VS details: reviewed  Fetal Assessment: FHT:  FHR: 135 bpm, variability: moderate,  accelerations:  Present,  decelerations:  Present intermittent variable decelerations with quick recovery Category/reactivity:  Category II UC:   irregular, every 5-20 minutes SVE:    Dilation: 5cm  Effacement: 80%  Station:  0  Consistency: soft  Position: posterior  Membrane status:intact Amniotic color: n/a  Labs: Lab Results  Component Value Date   WBC 7.6 12/12/2022   HGB 9.2 (L) 12/12/2022   HCT 27.4 (L) 12/12/2022   MCV 84.8 12/12/2022   PLT 163 12/12/2022    Assessment / Plan: 22 year old G2P0101 at [redacted]w[redacted]d here with preterm uterine contractions  Labor:  She was 3/80/-1 upon arrival to L&D with frequent uterine contractions q3-24min. She received terbutaline and continued contractions so started Magnesium 4g bolus then 1g/hr to help slow contractions. Contractions have decreased in frequency after starting magnesium. Received first dose BMZ at 0627. Cervical change from 3/80/-1 to 5/80/0. Continue magnesium. Notified Neonatologist of cervical change to come speak with her about what to expect with likely infant NICU admission. Also discussed with Bethany Macdonald and her partner that we are not augmenting her labor and are still trying to slow it down, but it may continue or may stop.  Preeclampsia:  on magnesium sulfate Fetal Wellbeing:  Category II Pain Control:  Labor support without medications I/D:   GBS negative, BOW intact Anticipated MOD:  NSVD  Gertie Fey, CNM 12/12/2022, 2:29  PM

## 2022-12-13 MED ORDER — BETAMETHASONE SOD PHOS & ACET 6 (3-3) MG/ML IJ SUSP
12.0000 mg | Freq: Once | INTRAMUSCULAR | Status: AC
  Administered 2022-12-13: 12 mg via INTRAMUSCULAR

## 2022-12-13 MED ORDER — PRENATAL MULTIVITAMIN CH
1.0000 | ORAL_TABLET | Freq: Every day | ORAL | Status: DC
  Administered 2022-12-14 – 2022-12-15 (×2): 1 via ORAL
  Filled 2022-12-13 (×2): qty 1

## 2022-12-13 MED ORDER — FERROUS SULFATE 325 (65 FE) MG PO TABS
325.0000 mg | ORAL_TABLET | Freq: Two times a day (BID) | ORAL | Status: DC
  Administered 2022-12-13 – 2022-12-15 (×4): 325 mg via ORAL
  Filled 2022-12-13 (×4): qty 1

## 2022-12-13 NOTE — Progress Notes (Signed)
Patient states during afternoon assessment that she has experienced three contractions 15 minutes apart within the last hour.

## 2022-12-13 NOTE — Progress Notes (Signed)
Patient transferred from 335 to OBS1 for NST. Call button in reach. Toco/FM applied and started 2227.

## 2022-12-13 NOTE — Progress Notes (Signed)
Patient states she has had 5 contractions within the last hour lasting 2 minutes approximately and 10 minutes apart.

## 2022-12-13 NOTE — Progress Notes (Signed)
L&D Note    Subjective:  Resting currently, support person at bedside   Objective:   Vitals:   12/13/22 0738 12/13/22 0835 12/13/22 0838 12/13/22 0841  BP: 102/63  99/60   Pulse: 98  (!) 108   Resp: 15 16    Temp: 97.9 F (36.6 C)     TempSrc: Oral     SpO2:  99%  100%  Weight:      Height:        Current Vital Signs 24h Vital Sign Ranges  T 97.9 F (36.6 C) Temp  Avg: 98.2 F (36.8 C)  Min: 97.9 F (36.6 C)  Max: 98.6 F (37 C)  BP 99/60 BP  Min: 89/50  Max: 105/62  HR (!) 108 Pulse  Avg: 106.8  Min: 91  Max: 116  RR 16 Resp  Avg: 16.8  Min: 15  Max: 18  SaO2 100 %   SpO2  Avg: 98.4 %  Min: 96 %  Max: 100 %      Gen: no distress, resting in bed  FHR: Baseline: 135 bpm, Variability: moderate, Accels: Present, Decels: none Toco: Occasional mild contractions  SVE: Dilation: 5 Effacement (%): 80 Cervical Position: Anterior Station: -1 Presentation: Vertex Exam by:: Wilson CNM -Done 12/12/2022 at 1400  Medications SCHEDULED MEDICATIONS   oxytocin 40 units in LR 1000 mL  333 mL Intravenous Once    MEDICATION INFUSIONS   lactated ringers     lactated ringers 125 mL/hr at 12/13/22 0030   lactated ringers     magnesium sulfate 1 g/hr (12/13/22 0030)   oxytocin      PRN MEDICATIONS  acetaminophen, fentaNYL (SUBLIMAZE) injection, lactated ringers, lidocaine (PF), ondansetron, sodium citrate-citric acid   Assessment & Plan:  22 y.o. G2P0101 at [redacted]w[redacted]d admitted for preterm labor  -Labor: contractions are now infrequent.  No indication for repeat cervical exam at this time.  Mag Sulfate discontinued at 0900, 2nd betamethasone given at 0630.   -Fetal Well-being: Category I -GBS: negative -Membranes intact -NICU consult completed  -Continue present management. -Will plan transfer to Antepartum if contractions remain infrequent and no s/s of progressing PTL.     Minda Meo, CNM  12/13/2022 10:10 AM  [facility]

## 2022-12-14 MED ORDER — NIFEDIPINE 10 MG PO CAPS
10.0000 mg | ORAL_CAPSULE | Freq: Four times a day (QID) | ORAL | Status: AC
  Administered 2022-12-14 (×2): 10 mg via ORAL
  Filled 2022-12-14 (×4): qty 1

## 2022-12-14 NOTE — Progress Notes (Signed)
Pt states that she has had 2 contractions within the last hour 10 minutes apart.

## 2022-12-14 NOTE — Progress Notes (Signed)
S: Pt reports contractions about q10, not stronger than earlier tonight, somewhat improved after voiding.   Dilation: 5 Effacement (%): 80 Cervical Position: Anterior Station: -1 Presentation: Vertex Exam by:: Wilson CNM  IV infiltrated, and patient with ice pack on arm  A: Preterm dilation P:  - Leave iv out for tonight, with replacement as needed - cervix unchanged - return to antepartum with standard monitoring

## 2022-12-14 NOTE — Progress Notes (Signed)
Patient wheeled back to antepartum room 335 with partner at bedside. Leafy Ro MD performed SVE w/ no cervical change. MD verbal order for 24hrs of Q6 nifedipine 10mg  and encourage PO hydration. No need to replace infiltrated PIV tonight, replace tomorrow morning or earlier if medically necessary. Loletha Grayer RN updated and resumed care.

## 2022-12-15 MED ORDER — NIFEDIPINE 10 MG PO CAPS: 10.0000 mg | ORAL_CAPSULE | Freq: Four times a day (QID) | ORAL | 0 refills | Status: DC

## 2022-12-15 MED ORDER — FERROUS SULFATE 325 (65 FE) MG PO TABS: 325.0000 mg | ORAL_TABLET | Freq: Two times a day (BID) | ORAL | 3 refills | Status: AC

## 2022-12-15 NOTE — Discharge Summary (Signed)
Patient ID: Nolan Sandefer MRN: [id number] DOB/AGE: [birth year] 22 y.o.  Admit date: 12/12/2022 Discharge date: 12/15/2022  Admission Diagnoses: preterm labor  Discharge Diagnoses: preterm labor  Factors complicating pregnancy: Prior Preterm birth at 86wks Iron deficiency anemia Chlamydia x 2 this pregnancy, Neg TOC 10/2022  Prenatal Procedures: NST  Consults: Neonatology,  Significant Diagnostic Studies:  Results for orders placed or performed during the hospital encounter of 12/12/22 (from the past 168 hour(s))  Group B strep by PCR   Collection Time: 12/12/22  6:15 AM   Specimen: Vaginal/Rectal; Genital  Result Value Ref Range   Group B strep by PCR NEGATIVE NEGATIVE  Wet prep, genital   Collection Time: 12/12/22  6:15 AM   Specimen: Vaginal/Rectal  Result Value Ref Range   Yeast Wet Prep HPF POC NONE SEEN NONE SEEN   Trich, Wet Prep NONE SEEN NONE SEEN   Clue Cells Wet Prep HPF POC NONE SEEN NONE SEEN   WBC, Wet Prep HPF POC <10 <10   Sperm PRESENT   CBC   Collection Time: 12/12/22  6:15 AM  Result Value Ref Range   WBC 7.6 4.0 - 10.5 K/uL   RBC 3.23 (L) 3.87 - 5.11 MIL/uL   Hemoglobin 9.2 (L) 12.0 - 15.0 g/dL   HCT 27.4 (L) 36.0 - 46.0 %   MCV 84.8 80.0 - 100.0 fL   MCH 28.5 26.0 - 34.0 pg   MCHC 33.6 30.0 - 36.0 g/dL   RDW 12.4 11.5 - 15.5 %   Platelets 163 150 - 400 K/uL   nRBC 0.0 0.0 - 0.2 %  RPR   Collection Time: 12/12/22  6:15 AM  Result Value Ref Range   RPR Ser Ql NON REACTIVE NON REACTIVE  Type and screen [place]   Collection Time: 12/12/22  6:15 AM  Result Value Ref Range   ABO/RH(D) O POS    Antibody Screen NEG    Sample Expiration      12/15/2022,2359 Performed at [facility], [address] [place], [place] [address]   Urinalysis, Routine w reflex microscopic -Urine, Clean Catch   Collection Time: 12/12/22  7:17 AM  Result Value Ref Range   Color, Urine YELLOW (A) YELLOW   APPearance CLEAR (A) CLEAR    Specific Gravity, Urine 1.013 1.005 - 1.030   pH 7.0 5.0 - 8.0   Glucose, UA NEGATIVE NEGATIVE mg/dL   Hgb urine dipstick NEGATIVE NEGATIVE   Bilirubin Urine NEGATIVE NEGATIVE   Ketones, ur NEGATIVE NEGATIVE mg/dL   Protein, ur NEGATIVE NEGATIVE mg/dL   Nitrite NEGATIVE NEGATIVE   Leukocytes,Ua MODERATE (A) NEGATIVE   WBC, UA 6-10 0 - 5 WBC/hpf   Bacteria, UA NONE SEEN NONE SEEN   Squamous Epithelial / HPF 0-5 0 - 5 /HPF   Mucus PRESENT    Sperm, UA PRESENT     Treatments: IV hydration, steroids: Betamethasone, Terbutaline and IV Magnesium Sulfate x24 hours. Oral Procardia 10 mg QID  Hospital Course:  This is a 22 y.o. G2P0101 with IUP at [redacted]w[redacted]d admitted for Preterm labor, noted to have a cervical exam of 3/80/-1 and progressed to 4.5-5/80/-1. No leaking of fluid and no bleeding.  She was initially started on magnesium sulfate for tocolysis and also received betamethasone x 2 doses.  Her tocolysis was transitioned to Procardia. She was seen by Neonatology during her stay.  She was observed, fetal heart rate monitoring remained reassuring, and she had no signs/symptoms of progressing preterm labor or other  maternal-fetal concerns.  Her cervical exam was unchanged from cervical exam 2 days ago.  Patient wants to go home and feels like she has reliable transportation to hospital in the event of labor. She denies any contractions since around lunch time yesterday. Patient is aware of the warning signs of preterm labor and understands that I would prefer for her to stay another day for reassurance of latent labor. She opted for discharge at this time. She was deemed stable for discharge to home with outpatient follow up.  Discharge Physical Exam:  BP 103/75 (BP Location: Left Arm)   Pulse 80   Temp 98.5 F (36.9 C) (Oral)   Resp 18   Ht 5\' 2"  (1.575 m)   Wt 50.8 kg   LMP 04/25/2022 (Exact Date)   SpO2 100%   BMI 20.49 kg/m   General: NAD CV: RRR Pulm: CTABL, nl effort ABD: s/nd/nt,  gravid DVT Evaluation: LE non-ttp, no evidence of DVT on exam.  FHT 162 BPM @1208  pm immediately prior to discharge home   SVE: deferred  Dilation: 4.5 (between 4-5 per MD) Effacement (%): 80 Cervical Position: Anterior Station: -1 Presentation: Vertex Exam by:: Denver Faster MD   Discharge Condition: Stable -F/u with [facility] providers in 3 days -continue Procardia QID for 2 days -Pelvic Rest d/t advance dilation -Report to nearest labor and delivery unit with any progressive signs of labor  Disposition: Discharge disposition: 01-Home or Self Care        Allergies as of 12/15/2022       Reactions   Bee Venom         Medication List     STOP taking these medications    ferrous sulfate 325 (65 FE) MG EC tablet Replaced by: ferrous sulfate 325 (65 FE) MG tablet       TAKE these medications    albuterol 108 (90 Base) MCG/ACT inhaler Commonly known as: VENTOLIN HFA Inhale 2 puffs into the lungs every 6 (six) hours as needed for wheezing or shortness of breath.   ferrous sulfate 325 (65 FE) MG tablet Take 1 tablet (325 mg total) by mouth 2 (two) times daily with a meal. Replaces: ferrous sulfate 325 (65 FE) MG EC tablet   fluticasone 50 MCG/ACT nasal spray Commonly known as: FLONASE Place 1 spray into both nostrils daily for 10 days.   NIFEdipine 10 MG capsule Commonly known as: PROCARDIA Take 1 capsule (10 mg total) by mouth 4 (four) times daily for 2 days.   prenatal multivitamin Tabs tablet Take 1 tablet by mouth daily at 12 noon.        Follow-up Information     [facility] [place] Follow up in 3 day(s).   Why: f/u for preterm labor Contact information: [address] [place] [address] [phone]                 Signed:  Avelino Leeds, CNM 12/15/2022 11:46 AM

## 2022-12-15 NOTE — Progress Notes (Signed)
Patient discharged home with family.  Discharge instructions, when to follow up, and prescriptions reviewed with patient.  Patient verbalized understanding. Patient will be escorted out by auxiliary.   

## 2022-12-15 NOTE — Discharge Instructions (Signed)
Pelvic Rest. No sex or heavy lifting!!

## 2023-09-13 NOTE — L&D Delivery Note (Signed)
 Delivery Note  Cassandr Cederberg is a [id number] at [redacted]w[redacted]d, Patient's last menstrual period was 05/12/2023 (approximate)., consistent with US  at [redacted]w[redacted]d. Estimated Date of Delivery: 02/16/24   First Stage: Labor onset: 0700 Augmentation: none Analgesia Darcie Easterly intrapartum: None SROM at 1017 GBS: negative  Second Stage: Complete dilation at 1008 Onset of pushing at 1008 - involuntarily  FHR second stage 145 bpm, no decelerations in FHR ausculted   Tarica presented to L&D in advanced labor and urge to push. Cervical exam on arrival was C/C/+2 with bulging bag of water. She was moved to labor room for immanent delivery. She pushed quickly over approximately 10 minutes for a spontaneous vaginal birth.  Delivery of a viable baby girl on 02/04/2024 at 1019 by CNM Delivery of fetal head in OA position with restitution to ROT. No nuchal cord;  Anterior then posterior shoulders delivered easily with gentle downward traction. Baby placed on mom's chest, and attended to by baby RN Cord double clamped after cessation of pulsation, cut by father of baby   Cord blood sample collection: Yes O POS  Third Stage: Oxytocin  bolus started after delivery of infant for hemorrhage prophylaxis  Placenta delivered via Ileana Mallard mechanism intact with 3 VC @ 1026 Placenta disposition: discarded  Uterine tone firm / bleeding small  Laceration: none Anesthesia for repair: N/A Suture: N/A Est. Blood Loss (mL): 200 ml   Complications: Precipitous delivery   Mom to postpartum.  Baby to Couplet care / Skin to Skin.  Newborn: Information for the patient's newborn:  Zeta, Bucy [[id number]]  Live born baby girl "Lyric" Birth Weight:  pending  APGAR: 8, 9   Newborn Delivery   Birth date/time: 02/04/2024 10:19:00 Delivery type: Vaginal, Spontaneous    Feeding planned: formula feeding  ---------- Fraser Jackson, CNM Certified Nurse Midwife [facility]

## 2023-10-21 ENCOUNTER — Emergency Department
Admission: EM | Admit: 2023-10-21 | Discharge: 2023-10-21 | Disposition: A | Discharge status: Home / Self Care | Payer: Medicaid Other | Attending: Emergency Medicine | Admitting: Emergency Medicine

## 2023-10-21 ENCOUNTER — Emergency Department: Payer: Medicaid Other

## 2023-10-21 ENCOUNTER — Other Ambulatory Visit: Payer: Self-pay

## 2023-10-21 DIAGNOSIS — Z3492 Encounter for supervision of normal pregnancy, unspecified, second trimester: Secondary | ICD-10-CM

## 2023-10-21 DIAGNOSIS — J101 Influenza due to other identified influenza virus with other respiratory manifestations: Secondary | ICD-10-CM | POA: Insufficient documentation

## 2023-10-21 DIAGNOSIS — Z20822 Contact with and (suspected) exposure to covid-19: Secondary | ICD-10-CM | POA: Diagnosis not present

## 2023-10-21 DIAGNOSIS — O98512 Other viral diseases complicating pregnancy, second trimester: Secondary | ICD-10-CM | POA: Insufficient documentation

## 2023-10-21 HISTORY — DX: Unspecified maternal hypertension, unspecified trimester: O16.9

## 2023-10-21 LAB — URINALYSIS, ROUTINE W REFLEX MICROSCOPIC
Bacteria, UA: NONE SEEN
Bilirubin Urine: NEGATIVE
Glucose, UA: NEGATIVE mg/dL
Hgb urine dipstick: NEGATIVE
Ketones, ur: NEGATIVE mg/dL
Nitrite: NEGATIVE
Protein, ur: NEGATIVE mg/dL
RBC / HPF: 0 RBC/hpf (ref 0–5)
Specific Gravity, Urine: 1.025 (ref 1.005–1.030)
pH: 7 (ref 5.0–8.0)

## 2023-10-21 LAB — RESP PANEL BY RT-PCR (RSV, FLU A&B, COVID)  RVPGX2
Influenza A by PCR: POSITIVE — AB
Influenza B by PCR: NEGATIVE
Resp Syncytial Virus by PCR: NEGATIVE
SARS Coronavirus 2 by RT PCR: NEGATIVE

## 2023-10-21 LAB — TROPONIN I (HIGH SENSITIVITY)
Troponin I (High Sensitivity): 3 ng/L (ref <18)
Troponin I (High Sensitivity): 3 ng/L (ref <18)

## 2023-10-21 LAB — CBC
HCT: 31.2 % — ABNORMAL LOW (ref 36.0–46.0)
Hemoglobin: 10.7 g/dL — ABNORMAL LOW (ref 12.0–15.0)
MCH: 31.5 pg (ref 26.0–34.0)
MCHC: 34.3 g/dL (ref 30.0–36.0)
MCV: 91.8 fL (ref 80.0–100.0)
Platelets: 187 10*3/uL (ref 150–400)
RBC: 3.4 MIL/uL — ABNORMAL LOW (ref 3.87–5.11)
RDW: 12.3 % (ref 11.5–15.5)
WBC: 5.7 10*3/uL (ref 4.0–10.5)
nRBC: 0 % (ref 0.0–0.2)

## 2023-10-21 LAB — BASIC METABOLIC PANEL
Anion gap: 9 (ref 5–15)
BUN: 12 mg/dL (ref 6–20)
CO2: 23 mmol/L (ref 22–32)
Calcium: 8.4 mg/dL — ABNORMAL LOW (ref 8.9–10.3)
Chloride: 102 mmol/L (ref 98–111)
Creatinine, Ser: 0.58 mg/dL (ref 0.44–1.00)
GFR, Estimated: >60 mL/min (ref >60)
Glucose, Bld: 97 mg/dL (ref 70–99)
Potassium: 3.7 mmol/L (ref 3.5–5.1)
Sodium: 134 mmol/L — ABNORMAL LOW (ref 135–145)

## 2023-10-21 LAB — D-DIMER, QUANTITATIVE: D-Dimer, Quant: 0.96 ug{FEU}/mL — ABNORMAL HIGH (ref 0.00–0.50)

## 2023-10-21 LAB — POC URINE PREG, ED: Preg Test, Ur: POSITIVE — AB

## 2023-10-21 MED ORDER — ONDANSETRON 4 MG PO TBDP: 4.0000 mg | ORAL_TABLET | Freq: Three times a day (TID) | ORAL | 0 refills | Status: DC | PRN

## 2023-10-21 MED ORDER — OSELTAMIVIR PHOSPHATE 75 MG PO CAPS: 75.0000 mg | ORAL_CAPSULE | Freq: Two times a day (BID) | ORAL | 0 refills | Status: AC

## 2023-10-21 NOTE — ED Provider Notes (Signed)
 [facility] Provider Note    Event Date/Time   First MD Initiated Contact with Patient 10/21/23 1213     (approximate)   History   Chief Complaint: Chest Pain   HPI  Bethany Macdonald is a 23 y.o. female G3, P2 currently [redacted] weeks pregnant who comes ED complaining of central chest tightness, nasal congestion, right lower abdominal pain that started this morning.  Gradual onset, nonexertional, no fever no dysuria.  Chest tightness somewhat worsened by deep breathing.          Physical Exam   Triage Vital Signs: ED Triage Vitals  Encounter Vitals Group     BP 10/21/23 1103 127/76     Systolic BP Percentile --      Diastolic BP Percentile --      Pulse Rate 10/21/23 1103 (!) 104     Resp 10/21/23 1103 18     Temp 10/21/23 1103 98 F (36.7 C)     Temp Source 10/21/23 1103 Oral     SpO2 10/21/23 1103 95 %     Weight --      Height --      Head Circumference --      Peak Flow --      Pain Score 10/21/23 1104 8     Pain Loc --      Pain Education --      Exclude from Growth Chart --     Most recent vital signs: Vitals:   10/21/23 1103  BP: 127/76  Pulse: (!) 104  Resp: 18  Temp: 98 F (36.7 C)  SpO2: 95%    General: Awake, no distress.  Rhinorrhea. CV:  Good peripheral perfusion.  Tachycardia heart rate 105.  Tender over sternum reproducing her chest pain Resp:  Normal effort.  Clear lungs Abd:  No distention.  Soft nontender    ED Results / Procedures / Treatments   Labs (all labs ordered are listed, but only abnormal results are displayed) Labs Reviewed  RESP PANEL BY RT-PCR (RSV, FLU A&B, COVID)  RVPGX2 - Abnormal; Notable for the following components:      Result Value   Influenza A by PCR POSITIVE (*)    All other components within normal limits  BASIC METABOLIC PANEL - Abnormal; Notable for the following components:   Sodium 134 (*)    Calcium  8.4 (*)    All other components within normal limits  CBC - Abnormal;  Notable for the following components:   RBC 3.40 (*)    Hemoglobin 10.7 (*)    HCT 31.2 (*)    All other components within normal limits  URINALYSIS, ROUTINE W REFLEX MICROSCOPIC - Abnormal; Notable for the following components:   Color, Urine YELLOW (*)    APPearance HAZY (*)    Leukocytes,Ua SMALL (*)    All other components within normal limits  D-DIMER, QUANTITATIVE - Abnormal; Notable for the following components:   D-Dimer, Quant 0.96 (*)    All other components within normal limits  POC URINE PREG, ED - Abnormal; Notable for the following components:   Preg Test, Ur Positive (*)    All other components within normal limits  TROPONIN I (HIGH SENSITIVITY)  TROPONIN I (HIGH SENSITIVITY)     EKG EKG interpreted by me Sinus tachycardia rate 111.  Normal axis intervals QRS ST segments and T waves   RADIOLOGY Ultrasound pelvis interpreted by me, live IUP, no adnexal mass.  Radiology report reviewed   PROCEDURES:  Procedures   MEDICATIONS ORDERED IN ED: Medications - No data to display   IMPRESSION / MDM / ASSESSMENT AND PLAN / ED COURSE  I reviewed the triage vital signs and the nursing notes.  DDx: Influenza, COVID, UTI, dehydration, non-STEMI, PE  Patient's presentation is most consistent with acute presentation with potential threat to life or bodily function.  Patient presents with chest pain which appears to be musculoskeletal symptoms of viral illness.  Will check labs   Clinical Course as of 10/21/23 1531  Sat Oct 21, 2023  1510 Labs reveal flu+. YEARS algorithm excludes PE (no criteria, d-d < 1.0). US  unremarkable. Stable for DC with supportive care [PS]    Clinical Course User Index [PS] Viviann Pastor, MD     FINAL CLINICAL IMPRESSION(S) / ED DIAGNOSES   Final diagnoses:  Influenza A  Second trimester pregnancy     Rx / DC Orders   ED Discharge Orders          Ordered    ondansetron  (ZOFRAN -ODT) 4 MG disintegrating tablet  Every 8  hours PRN        10/21/23 1528    oseltamivir  (TAMIFLU ) 75 MG capsule  2 times daily        10/21/23 1528             Note:  This document was prepared using Dragon voice recognition software and may include unintentional dictation errors.   Viviann Pastor, MD 10/21/23 [phone]

## 2023-10-21 NOTE — Discharge Instructions (Addendum)
 You can take tylenol  1000mg  every 8 hours as needed for pain and fever.

## 2023-10-21 NOTE — ED Triage Notes (Addendum)
 Pt c/o constant central CP and lower abdominal pain that started this am w/ mild SHOB. Pt denies dizziness, SHOB, cough, congestion. Pt is AOX4, NAD noted. Pt is [redacted] weeks pregnant.

## 2023-11-15 DIAGNOSIS — Z3493 Encounter for supervision of normal pregnancy, unspecified, third trimester: Secondary | ICD-10-CM | POA: Insufficient documentation

## 2023-11-15 DIAGNOSIS — O09899 Supervision of other high risk pregnancies, unspecified trimester: Secondary | ICD-10-CM

## 2023-11-15 DIAGNOSIS — O0930 Supervision of pregnancy with insufficient antenatal care, unspecified trimester: Secondary | ICD-10-CM | POA: Insufficient documentation

## 2023-11-15 LAB — OB RESULTS CONSOLE GC/CHLAMYDIA
Chlamydia: NEGATIVE
Neisseria Gonorrhea: NEGATIVE

## 2023-11-15 LAB — OB RESULTS CONSOLE RUBELLA ANTIBODY, IGM: Rubella: IMMUNE

## 2023-11-15 LAB — OB RESULTS CONSOLE HEPATITIS B SURFACE ANTIGEN: Hepatitis B Surface Ag: NEGATIVE

## 2023-11-15 LAB — OB RESULTS CONSOLE VARICELLA ZOSTER ANTIBODY, IGG: Varicella: IMMUNE

## 2023-11-15 LAB — OB RESULTS CONSOLE HIV ANTIBODY (ROUTINE TESTING): HIV: NONREACTIVE

## 2023-11-16 DIAGNOSIS — O99013 Anemia complicating pregnancy, third trimester: Secondary | ICD-10-CM | POA: Diagnosis present

## 2023-11-24 LAB — PANORAMA PRENATAL TEST FULL PANEL:PANORAMA TEST PLUS 5 ADDITIONAL MICRODELETIONS: FETAL FRACTION: 20

## 2023-11-29 DIAGNOSIS — O2613 Low weight gain in pregnancy, third trimester: Secondary | ICD-10-CM | POA: Insufficient documentation

## 2024-01-12 ENCOUNTER — Encounter: Payer: Self-pay | Admitting: Obstetrics and Gynecology

## 2024-01-12 ENCOUNTER — Other Ambulatory Visit: Payer: Self-pay

## 2024-01-12 ENCOUNTER — Observation Stay
Admission: EM | Admit: 2024-01-12 | Discharge: 2024-01-12 | Disposition: A | Discharge status: Home / Self Care | Attending: Certified Nurse Midwife | Admitting: Obstetrics and Gynecology

## 2024-01-12 DIAGNOSIS — Z3A35 35 weeks gestation of pregnancy: Secondary | ICD-10-CM | POA: Insufficient documentation

## 2024-01-12 DIAGNOSIS — R109 Unspecified abdominal pain: Secondary | ICD-10-CM | POA: Diagnosis not present

## 2024-01-12 DIAGNOSIS — O26893 Other specified pregnancy related conditions, third trimester: Principal | ICD-10-CM | POA: Insufficient documentation

## 2024-01-12 DIAGNOSIS — O133 Gestational [pregnancy-induced] hypertension without significant proteinuria, third trimester: Secondary | ICD-10-CM | POA: Insufficient documentation

## 2024-01-12 DIAGNOSIS — R197 Diarrhea, unspecified: Principal | ICD-10-CM | POA: Diagnosis present

## 2024-01-12 LAB — URINALYSIS, ROUTINE W REFLEX MICROSCOPIC
Bilirubin Urine: NEGATIVE
Glucose, UA: NEGATIVE mg/dL
Hgb urine dipstick: NEGATIVE
Ketones, ur: NEGATIVE mg/dL
Nitrite: NEGATIVE
Protein, ur: 30 mg/dL — AB
Specific Gravity, Urine: 1.02 (ref 1.005–1.030)
pH: 6 (ref 5.0–8.0)

## 2024-01-12 MED ORDER — CALCIUM CARBONATE ANTACID 500 MG PO CHEW: 2.0000 | CHEWABLE_TABLET | ORAL | Status: DC | PRN

## 2024-01-12 MED ORDER — DOCUSATE SODIUM 100 MG PO CAPS: 100.0000 mg | ORAL_CAPSULE | Freq: Every day | ORAL | Status: DC

## 2024-01-12 MED ORDER — ONDANSETRON HCL 4 MG/2ML IJ SOLN
4.0000 mg | Freq: Four times a day (QID) | INTRAMUSCULAR | Status: DC | PRN
  Administered 2024-01-12: 4 mg via INTRAVENOUS
  Filled 2024-01-12: qty 2

## 2024-01-12 MED ORDER — ACETAMINOPHEN 325 MG PO TABS: 650.0000 mg | ORAL_TABLET | ORAL | Status: DC | PRN

## 2024-01-12 MED ORDER — PRENATAL MULTIVITAMIN CH: 1.0000 | ORAL_TABLET | Freq: Every day | ORAL | Status: DC

## 2024-01-12 MED ORDER — LACTATED RINGERS IV SOLN: 125.0000 mL/h | INTRAVENOUS | Status: DC

## 2024-01-12 MED ORDER — LACTATED RINGERS IV SOLN: Freq: Once | INTRAVENOUS | Status: AC

## 2024-01-12 MED ORDER — ZOLPIDEM TARTRATE 5 MG PO TABS: 5.0000 mg | ORAL_TABLET | Freq: Every evening | ORAL | Status: DC | PRN

## 2024-01-12 NOTE — Discharge Summary (Cosign Needed Addendum)
 Bethany Macdonald is a 23 y.o. female. She is at [redacted]w[redacted]d gestation. Patient's last menstrual period was 05/12/2023 (approximate). Estimated Date of Delivery: 02/16/24  Prenatal care site: [facility]  Current pregnancy complicated by:  - low weight gain in pregnancy - anemia in pregnancy despite iron infusions - short interval between pregnancies  Chief complaint: diarrhea and abdominal pain  She came in this evening reporting diarrhea and abdominal pain. Several family members are also experiencing diarrhea as well. She reports some mild cramping and abdominal pain. She reports good fetal movement and denies bleeding or leaking fluid.   S: Resting comfortably. no CTX, no VB.no LOF,  Active fetal movement.  Denies: HA, visual changes, SOB, or RUQ/epigastric pain  Maternal Medical History:   Past Medical History:  Diagnosis Date   Anemia    Hypertension in pregnancy     Past Surgical History:  Procedure Laterality Date   NO PAST SURGERIES      Allergies  Allergen Reactions   Bee Venom     Prior to Admission medications   Medication Sig Start Date End Date Taking? Authorizing Provider  ferrous sulfate  325 (65 FE) MG tablet Take 1 tablet (325 mg total) by mouth 2 (two) times daily with a meal. 12/15/22  Yes Arzella Laurence, CNM  Prenatal Vit-Fe Fumarate-FA (PRENATAL MULTIVITAMIN) TABS tablet Take 1 tablet by mouth daily at 12 noon.   Yes [provider]  albuterol  (VENTOLIN  HFA) 108 (90 Base) MCG/ACT inhaler Inhale 2 puffs into the lungs every 6 (six) hours as needed for wheezing or shortness of breath. 10/12/22   Art Bigness, PA  fluticasone  (FLONASE ) 50 MCG/ACT nasal spray Place 1 spray into both nostrils daily for 10 days. 10/12/22 10/22/22  Art Bigness, PA  NIFEdipine  (PROCARDIA ) 10 MG capsule Take 1 capsule (10 mg total) by mouth 4 (four) times daily for 2 days. 12/15/22 12/17/22  Arzella Laurence, CNM  ondansetron  (ZOFRAN -ODT) 4 MG disintegrating tablet Take 1  tablet (4 mg total) by mouth every 8 (eight) hours as needed for nausea or vomiting. Patient not taking: Reported on 01/12/2024 10/21/23   Jacquie Maudlin, MD    Social History: She  reports that she has never smoked. She has never used smokeless tobacco. She reports that she does not currently use alcohol. She reports that she does not use drugs.  Family History: family history is not on file.  no history of gyn cancers  Review of Systems: A full review of systems was performed and negative except as noted in the HPI.    O:  BP 108/63 (BP Location: Right Arm)   Pulse (!) 103   Temp 99.1 F (37.3 C) (Oral)   Resp 18   Ht 5\' 2"  (1.575 m)   Wt 49 kg   LMP 05/12/2023 (Approximate)   BMI 19.75 kg/m  Results for orders placed or performed during the hospital encounter of 01/12/24 (from the past 48 hours)  Urinalysis, Routine w reflex microscopic -Urine, Clean Catch   Collection Time: 01/12/24 10:33 PM  Result Value Ref Range   Color, Urine YELLOW (A) YELLOW   APPearance HAZY (A) CLEAR   Specific Gravity, Urine 1.020 1.005 - 1.030   pH 6.0 5.0 - 8.0   Glucose, UA NEGATIVE NEGATIVE mg/dL   Hgb urine dipstick NEGATIVE NEGATIVE   Bilirubin Urine NEGATIVE NEGATIVE   Ketones, ur NEGATIVE NEGATIVE mg/dL   Protein, ur 30 (A) NEGATIVE mg/dL   Nitrite NEGATIVE NEGATIVE   Leukocytes,Ua MODERATE (A) NEGATIVE  RBC / HPF 0-5 0 - 5 RBC/hpf   WBC, UA 21-50 0 - 5 WBC/hpf   Bacteria, UA RARE (A) NONE SEEN   Squamous Epithelial / HPF 6-10 0 - 5 /HPF   Mucus PRESENT      Constitutional: NAD, AAOx3  HE/ENT: extraocular movements grossly intact, moist mucous membranes CV: RRR PULM: nl respiratory effort, CTABL     Abd: gravid, non-tender, non-distended, soft      Ext: Non-tender, Nonedematous   Psych: mood appropriate, speech normal Pelvic: deferred  Fetal  monitoring: Cat 1 Appropriate for GA Baseline: 135bpm Variability: moderate Accelerations: present x >2 Decelerations absent Uterine  contractions: rare, >2min apart Time  A/P: 23 y.o. [redacted]w[redacted]d here for antenatal surveillance for diarrhea and abdominal pain  Principle Diagnosis:  Diarrhea, abdominal pain in pregnancy  Diarrhea: IVF LR bolus 1000ml x 1, IV Zofran  4mg . UA negative. She reports her symptoms have improved and she is feeling better and ready for discharge. Labor: not present. Rare contractions, apart, she is not feeling contractions, no indication to check her cervix at this time. Fetal Wellbeing: Reassuring Cat 1 tracing. D/c home stable, precautions reviewed, follow-up as scheduled.    Auston Left, CNM 01/12/2024 11:09 PM

## 2024-01-15 ENCOUNTER — Observation Stay
Admission: EM | Admit: 2024-01-15 | Discharge: 2024-01-16 | Disposition: A | Discharge status: Home / Self Care | Attending: Obstetrics and Gynecology | Admitting: Obstetrics and Gynecology

## 2024-01-15 ENCOUNTER — Other Ambulatory Visit: Payer: Self-pay

## 2024-01-15 ENCOUNTER — Encounter: Payer: Self-pay | Admitting: Obstetrics and Gynecology

## 2024-01-15 DIAGNOSIS — O4703 False labor before 37 completed weeks of gestation, third trimester: Principal | ICD-10-CM | POA: Insufficient documentation

## 2024-01-15 DIAGNOSIS — O10013 Pre-existing essential hypertension complicating pregnancy, third trimester: Secondary | ICD-10-CM | POA: Insufficient documentation

## 2024-01-15 DIAGNOSIS — Z79899 Other long term (current) drug therapy: Secondary | ICD-10-CM | POA: Diagnosis not present

## 2024-01-15 DIAGNOSIS — Z3A35 35 weeks gestation of pregnancy: Secondary | ICD-10-CM | POA: Diagnosis not present

## 2024-01-15 DIAGNOSIS — O4700 False labor before 37 completed weeks of gestation, unspecified trimester: Principal | ICD-10-CM | POA: Diagnosis present

## 2024-01-15 LAB — URINALYSIS, COMPLETE (UACMP) WITH MICROSCOPIC
Bilirubin Urine: NEGATIVE
Glucose, UA: NEGATIVE mg/dL
Hgb urine dipstick: NEGATIVE
Ketones, ur: NEGATIVE mg/dL
Leukocytes,Ua: NEGATIVE
Nitrite: NEGATIVE
Protein, ur: NEGATIVE mg/dL
Specific Gravity, Urine: 1.012 (ref 1.005–1.030)
pH: 7 (ref 5.0–8.0)

## 2024-01-15 LAB — WET PREP, GENITAL
Clue Cells Wet Prep HPF POC: NONE SEEN
Trich, Wet Prep: NONE SEEN
WBC, Wet Prep HPF POC: <10 (ref <10)
Yeast Wet Prep HPF POC: NONE SEEN

## 2024-01-15 LAB — GROUP B STREP BY PCR: Group B strep by PCR: NEGATIVE

## 2024-01-15 MED ORDER — LACTATED RINGERS IV SOLN: INTRAVENOUS | Status: DC

## 2024-01-15 MED ORDER — LACTATED RINGERS IV BOLUS
1000.0000 mL | Freq: Once | INTRAVENOUS | Status: AC
  Administered 2024-01-15: 1000 mL via INTRAVENOUS

## 2024-01-15 MED ORDER — ACETAMINOPHEN 325 MG PO TABS: 650.0000 mg | ORAL_TABLET | ORAL | Status: DC | PRN

## 2024-01-15 NOTE — Progress Notes (Signed)
 S: Pt reports no change in UCs O: sve 2/70/0 A: 23 y.o. [id number] female at [redacted]w[redacted]d with preterm uterine contractions  P: Obs overnight, NST in the morning, Growth u/s planned for ~0800, continue IVF hydration  Jenifer E Neka Bise 01/15/2024 8:14 PM

## 2024-01-15 NOTE — OB Triage Note (Signed)
 Patient is a [id number] at [redacted]w[redacted]d who presents to unit c/o contractions that started around 1300 and are 5 minutes apart. Reports +fetal movement, denies vaginal bleeding and leakage of fluid. Reports pain and pressure with urination. External monitors applied and assessing. Initial FHT 155. Vital signs WDL.

## 2024-01-15 NOTE — H&P (Signed)
 OB History & Physical   History of Present Illness:  Chief Complaint:   HPI:  Bethany Macdonald is a 24 y.o. [id number] female at [redacted]w[redacted]d gestation. Patient's last menstrual period was 05/12/2023 (approximate).  Estimated Date of Delivery: 02/16/24.  She is here for uterine contractions  She reports:  -active fetal movement -no leakage of fluid -no vaginal bleeding -onset of contractions at 1300 currently every 5 minutes  Pregnancy Issues: Late to prenatal care - only 4 PNV total low weight gain in pregnancy  anemia in pregnancy despite iron infusions short interval between pregnancies   Maternal Medical History:   Past Medical History:  Diagnosis Date   Anemia    Hypertension in pregnancy     Past Surgical History:  Procedure Laterality Date   NO PAST SURGERIES      Allergies  Allergen Reactions   Bee Venom     Prior to Admission medications   Medication Sig Start Date End Date Taking? Authorizing Provider  albuterol  (VENTOLIN  HFA) 108 (90 Base) MCG/ACT inhaler Inhale 2 puffs into the lungs every 6 (six) hours as needed for wheezing or shortness of breath. 10/12/22   Art Bigness, PA  ferrous sulfate  325 (65 FE) MG tablet Take 1 tablet (325 mg total) by mouth 2 (two) times daily with a meal. 12/15/22   Arzella Laurence, CNM  fluticasone  (FLONASE ) 50 MCG/ACT nasal spray Place 1 spray into both nostrils daily for 10 days. 10/12/22 10/22/22  Art Bigness, PA  NIFEdipine  (PROCARDIA ) 10 MG capsule Take 1 capsule (10 mg total) by mouth 4 (four) times daily for 2 days. 12/15/22 12/17/22  Arzella Laurence, CNM  ondansetron  (ZOFRAN -ODT) 4 MG disintegrating tablet Take 1 tablet (4 mg total) by mouth every 8 (eight) hours as needed for nausea or vomiting. Patient not taking: Reported on 01/12/2024 10/21/23   Jacquie Maudlin, MD  Prenatal Vit-Fe Fumarate-FA (PRENATAL MULTIVITAMIN) TABS tablet Take 1 tablet by mouth daily at 12 noon.    [provider]     Prenatal care  site: [facility]  Social History: She  reports that she has never smoked. She has never used smokeless tobacco. She reports that she does not currently use alcohol. She reports that she does not use drugs.  Family History: family history is not on file.   Review of Systems: A full review of systems was performed and negative except as noted in the HPI.    Physical Exam:  Vital Signs: BP (!) 96/57 (BP Location: Left Arm)   Pulse 98   Temp 98.4 F (36.9 C) (Oral)   Resp 15   Ht 5\' 2"  (1.575 m)   Wt 49 kg   LMP 05/12/2023 (Approximate)   BMI 19.75 kg/m   General:   alert and cooperative  Skin:  normal  Neurologic:    Alert & oriented x 3  Lungs:    Nl   Heart:   regular rate and rhythm  Abdomen:  soft, non-tender; bowel sounds normal; no masses,  no organomegaly  Extremities: : non-tender, symmetric, no edema bilaterally.       Pertinent Results:  Prenatal Labs: Blood type/Rh O pos  Antibody screen neg  Rubella Immune  Varicella Immune  RPR NR  HBsAg Neg  HIV NR  GC neg  Chlamydia neg  Genetic screening negative  1 hour GTT 86  3 hour GTT   GBS pending   FHT: FHR: 135 bpm, variability: moderate,  accelerations:  Present,  decelerations:  Absent Category/reactivity:  Category I TOCO: regular, every 3-5 minutes SVE: Dilation: 2 / Effacement (%): 70 / Station: 0     Assessment:  Bethany Macdonald is a 23 y.o. G2X5284 female at [redacted]w[redacted]d with preterm uterine contractions.   Plan:  1. Admit to Labor & Delivery for overnight obs; consents reviewed and obtained - Dr. Baker Bon. Schermerhorn notified of patient and orders received  2. Fetal Well being  - Fetal Tracing: Cat I - GBS collected  - Presentation: vtx confirmed by sve   3. Preterm Labor - Clear fluids, IVF - Cervical checks - Swabs collected  4. Dysuria - UA collected    Collin Deal, CNM 01/15/2024 6:17 PM

## 2024-01-16 ENCOUNTER — Observation Stay

## 2024-01-16 DIAGNOSIS — O4703 False labor before 37 completed weeks of gestation, third trimester: Secondary | ICD-10-CM | POA: Diagnosis not present

## 2024-01-16 NOTE — Discharge Summary (Signed)
 Bethany Macdonald is a 23 y.o. female. She is at [redacted]w[redacted]d gestation. Patient's last menstrual period was 05/12/2023 (approximate). 02/16/2024, Date entered prior to episode creation   Prenatal care site:  [facility]  Chief complaint: Contractions   Admission Diagnoses:  1) intrauterine pregnancy at [redacted]w[redacted]d  2) Preterm uterine contractions [O47.00]  Discharge Diagnoses:  Principal Problem:   Preterm uterine contractions  HPI: Bethany Macdonald presents to L&D with complaints of contractions that started yesterday at 1300.  Her pregnancy is complicated by history of preterm delivery at 35 weeks x 2, late prenatal care, poor weight gain in pregnancy, short interval between pregnancies, anemia in pregnancy .  She denies Loss of fluid or Vaginal bleeding. Endorses fetal movement as active.   S: Resting comfortably. no VB.no LOF,  Active fetal movement.   Maternal Medical History:  Past Medical Hx:  has a past medical history of Anemia and Hypertension in pregnancy.    Past Surgical Hx:  has a past surgical history that includes No past surgeries.   Allergies  Allergen Reactions   Bee Venom      Prior to Admission medications   Medication Sig Start Date End Date Taking? Authorizing Provider  albuterol  (VENTOLIN  HFA) 108 (90 Base) MCG/ACT inhaler Inhale 2 puffs into the lungs every 6 (six) hours as needed for wheezing or shortness of breath. 10/12/22   Art Bigness, PA  ferrous sulfate  325 (65 FE) MG tablet Take 1 tablet (325 mg total) by mouth 2 (two) times daily with a meal. 12/15/22   Arzella Laurence, CNM  fluticasone  (FLONASE ) 50 MCG/ACT nasal spray Place 1 spray into both nostrils daily for 10 days. 10/12/22 10/22/22  Art Bigness, PA  NIFEdipine  (PROCARDIA ) 10 MG capsule Take 1 capsule (10 mg total) by mouth 4 (four) times daily for 2 days. 12/15/22 12/17/22  Arzella Laurence, CNM  ondansetron  (ZOFRAN -ODT) 4 MG disintegrating tablet Take 1 tablet (4 mg total) by mouth every 8 (eight) hours  as needed for nausea or vomiting. Patient not taking: Reported on 01/12/2024 10/21/23   Jacquie Maudlin, MD  Prenatal Vit-Fe Fumarate-FA (PRENATAL MULTIVITAMIN) TABS tablet Take 1 tablet by mouth daily at 12 noon.    [provider]    Social History: She  reports that she has never smoked. She has never used smokeless tobacco. She reports that she does not currently use alcohol. She reports that she does not use drugs.  Family History: family history is not on file.   Review of Systems: A full review of systems was performed and negative except as noted in the HPI.     Pertinent Results:   O:  BP 106/68 (BP Location: Right Arm)   Pulse 94   Temp 98.1 F (36.7 C) (Oral)   Resp 16   Ht 5\' 2"  (1.575 m)   Wt 49 kg   LMP 05/12/2023 (Approximate)   BMI 19.75 kg/m  Results for orders placed or performed during the hospital encounter of 01/15/24 (from the past 48 hours)  Wet prep, genital   Collection Time: 01/15/24  6:05 PM  Result Value Ref Range   Yeast Wet Prep HPF POC NONE SEEN NONE SEEN   Trich, Wet Prep NONE SEEN NONE SEEN   Clue Cells Wet Prep HPF POC NONE SEEN NONE SEEN   WBC, Wet Prep HPF POC <10 <10   Sperm PRESENT   Urinalysis, Complete w Microscopic -Urine, Clean Catch   Collection Time: 01/15/24  6:05 PM  Result Value Ref  Range   Color, Urine YELLOW (A) YELLOW   APPearance CLEAR (A) CLEAR   Specific Gravity, Urine 1.012 1.005 - 1.030   pH 7.0 5.0 - 8.0   Glucose, UA NEGATIVE NEGATIVE mg/dL   Hgb urine dipstick NEGATIVE NEGATIVE   Bilirubin Urine NEGATIVE NEGATIVE   Ketones, ur NEGATIVE NEGATIVE mg/dL   Protein, ur NEGATIVE NEGATIVE mg/dL   Nitrite NEGATIVE NEGATIVE   Leukocytes,Ua NEGATIVE NEGATIVE   RBC / HPF 0-5 0 - 5 RBC/hpf   WBC, UA 0-5 0 - 5 WBC/hpf   Bacteria, UA RARE (A) NONE SEEN   Squamous Epithelial / HPF 0-5 0 - 5 /HPF   Mucus PRESENT   Group B strep by PCR   Collection Time: 01/15/24  6:06 PM   Specimen: Vaginal/Rectal; Genital   Result Value Ref Range   Group B strep by PCR PRESUMPTIVE NEGATIVE PRESUMPTIVE NEGATIVE    US  OB Comp + 14 Wk Result Date: 01/16/2024 CLINICAL DATA:  Preterm labor EXAM: OBSTETRICAL ULTRASOUND >14 WKS FINDINGS: Number of Fetuses: 1 Heart Rate:  143 bpm Movement: Yes Presentation: Cephalic Previa: No Placental Location: Anterior Amniotic Fluid (Subjective): Normal Amniotic Fluid (Objective): Vertical pocket = 4.5cm AFI = 11.9 cm FETAL BIOMETRY BPD: 8.29cm 33w 2d HC: 31.23cm  35w   0d AC: 29.47cm  33w   3d FL: 6.54cm  33w   5d Current Mean GA: 33w 5d US  EDC: 02/29/2024 Assigned GA:  35w 4d Assigned EDC: 02/16/2024 Estimated Fetal Weight:  2249.2g 8.7%ile FETAL ANATOMY Lateral Ventricles: Appears normal Thalami/CSP: Appears normal Posterior Fossa:  Not visualized Nuchal Region: Not visualized Upper Lip: Appears normal Spine: Not visualized 4 Chamber Heart on Left: Appears normal LVOT: Not visualized RVOT: Not visualized Stomach on Left: Appears normal 3 Vessel Cord: Appears normal Cord Insertion site: Not visualized Kidneys: Asymmetrically larger right kidney. Otherwise normal appearance. Bladder: Appears normal Extremities: Limited views appear normal Sex: Not Visualized Technically difficult due to: Fetal positioning Maternal Findings: Cervix:  Not well seen due to fetal presentation. IMPRESSION: 1. Single active intrauterine pregnancy with estimated gestational age [redacted] weeks 5 days, 2 weeks behind assigned gestational age of [redacted] weeks 4 days. 2. Estimated fetal weight 8.7 percentile. 3. Fetal anatomic survey as described. Asymmetrically larger right kidney, which may be artifactual related to fetal positioning and technique. Recommend postnatal ultrasound examination for further evaluation. Electronically Signed   By: Limin  Xu M.D.   On: 01/16/2024 11:36    Constitutional: NAD, AAOx3  PULM: nl respiratory effort Abd: gravid, non-tender Ext: Non-tender, Nonedmeatous Psych: mood appropriate, speech  normal SVE: Dilation: 2 Effacement (%): 70 Station: 0 Exam by:: Oxley CNM  -Exam unchanged > 12 hours   NST: Baseline FHR: 135 beats/min Variability: moderate Accelerations: present Decelerations: absent Tocometry: Infrequent Time: at least 20 minutes   Interpretation: Category I INDICATIONS: rule out uterine contractions RESULTS:  A NST procedure was performed with FHR monitoring and a normal baseline established, appropriate time of 20-40 minutes of evaluation, and accels >2 seen w 15x15 characteristics.  Results show a REACTIVE NST.   Consults: None  Procedures: NST, ultrasound   Hospital Course: The patient was admitted to Labor and Delivery Triage for observation. She received IVF fluid bolus and swabs were collected for GBS and screening for vaginal infections. Contractions became less frequent after IVF fluid bolus. GBS and wet prep were both negative. Cervix was unchanged after > 12 hours and Darshana reported feeling better and significant improvement in contractions. Growth US  was completed  prior to discharge d/t late [facility], poor weigh gain, and short interval pregnancy.  EFW was 8%tile with normal amniotic fluid. Reviewed US  imaging with Dr. Alvia Awkward along with Ayesha Lente and her partner. Recommend short follow up with primary prenatal provider for further antepartum management. Strict preterm labor precautions were reviewed and when to return to the hospital. She was encouraged to go to [facility] where she receives prenatal care but [facility] was an option if she did not feel like she could make it to [facility] She was deemed stable for discharge and further outpatient management.   Discharge Condition: stable  Disposition: Discharge disposition: 01-Home or Self Care       Allergies as of 01/16/2024       Reactions   Bee Venom         Medication List     STOP taking these medications    fluticasone  50 MCG/ACT nasal spray Commonly known as: FLONASE    NIFEdipine  10 MG  capsule Commonly known as: PROCARDIA    ondansetron  4 MG disintegrating tablet Commonly known as: ZOFRAN -ODT       TAKE these medications    albuterol  108 (90 Base) MCG/ACT inhaler Commonly known as: VENTOLIN  HFA Inhale 2 puffs into the lungs every 6 (six) hours as needed for wheezing or shortness of breath.   ferrous sulfate  325 (65 FE) MG tablet Take 1 tablet (325 mg total) by mouth 2 (two) times daily with a meal.   prenatal multivitamin Tabs tablet Take 1 tablet by mouth daily at 12 noon.        Follow-up Information     [facility] Follow up in 3 day(s).   Why: routine prenatal appointment               ----- Teodora Fell, CNM  Certified Nurse Midwife [place]  [facility]

## 2024-01-16 NOTE — Progress Notes (Signed)
 S: Pt reports improvement in UCs O: sve 2/70/0 --> no cervical change; NST this morning reactive with BL of 135 and 2+ accels noted, no decels A: 23 y.o. [id number] female at [redacted]w[redacted]d with preterm uterine contractions  P: Growth u/s planned for ~0800, continue IVF hydration  Bethany Macdonald 01/16/2024 7:36 AM

## 2024-01-16 NOTE — Progress Notes (Signed)
 Patient transported to ultrasound.

## 2024-01-16 NOTE — Progress Notes (Signed)
 Discharge instructions provided to patient. Patient verbalized understanding. Pt educated on signs and symptoms of labor, vaginal bleeding, LOF, and fetal movement. Red flag signs reviewed by RN. Patient discharged home with significant other in stable condition.

## 2024-01-16 NOTE — Discharge Instructions (Signed)

## 2024-01-26 ENCOUNTER — Other Ambulatory Visit: Payer: Self-pay

## 2024-01-26 ENCOUNTER — Encounter: Payer: Self-pay | Admitting: Obstetrics and Gynecology

## 2024-01-26 ENCOUNTER — Observation Stay
Admission: EM | Admit: 2024-01-26 | Discharge: 2024-01-27 | Disposition: A | Discharge status: Home / Self Care | Attending: Certified Nurse Midwife | Admitting: Certified Nurse Midwife

## 2024-01-26 ENCOUNTER — Observation Stay

## 2024-01-26 DIAGNOSIS — Z8751 Personal history of pre-term labor: Secondary | ICD-10-CM

## 2024-01-26 DIAGNOSIS — O26893 Other specified pregnancy related conditions, third trimester: Principal | ICD-10-CM | POA: Insufficient documentation

## 2024-01-26 DIAGNOSIS — R109 Unspecified abdominal pain: Secondary | ICD-10-CM | POA: Diagnosis not present

## 2024-01-26 DIAGNOSIS — Z3A37 37 weeks gestation of pregnancy: Secondary | ICD-10-CM | POA: Insufficient documentation

## 2024-01-26 DIAGNOSIS — O26899 Other specified pregnancy related conditions, unspecified trimester: Secondary | ICD-10-CM | POA: Diagnosis present

## 2024-01-26 DIAGNOSIS — O99013 Anemia complicating pregnancy, third trimester: Secondary | ICD-10-CM | POA: Diagnosis present

## 2024-01-26 DIAGNOSIS — O09899 Supervision of other high risk pregnancies, unspecified trimester: Secondary | ICD-10-CM

## 2024-01-26 DIAGNOSIS — O479 False labor, unspecified: Principal | ICD-10-CM | POA: Diagnosis present

## 2024-01-26 DIAGNOSIS — O2613 Low weight gain in pregnancy, third trimester: Secondary | ICD-10-CM | POA: Diagnosis present

## 2024-01-26 DIAGNOSIS — O365990 Maternal care for other known or suspected poor fetal growth, unspecified trimester, not applicable or unspecified: Secondary | ICD-10-CM | POA: Diagnosis present

## 2024-01-26 LAB — CHLAMYDIA/NGC RT PCR (ARMC ONLY)
Chlamydia Tr: NOT DETECTED
N gonorrhoeae: NOT DETECTED

## 2024-01-26 LAB — CBC
HCT: 28.3 % — ABNORMAL LOW (ref 36.0–46.0)
Hemoglobin: 9.5 g/dL — ABNORMAL LOW (ref 12.0–15.0)
MCH: 27.7 pg (ref 26.0–34.0)
MCHC: 33.6 g/dL (ref 30.0–36.0)
MCV: 82.5 fL (ref 80.0–100.0)
Platelets: 207 10*3/uL (ref 150–400)
RBC: 3.43 MIL/uL — ABNORMAL LOW (ref 3.87–5.11)
RDW: 13.2 % (ref 11.5–15.5)
WBC: 6.1 10*3/uL (ref 4.0–10.5)
nRBC: 0 % (ref 0.0–0.2)

## 2024-01-26 LAB — URINALYSIS, COMPLETE (UACMP) WITH MICROSCOPIC
Bilirubin Urine: NEGATIVE
Glucose, UA: NEGATIVE mg/dL
Hgb urine dipstick: NEGATIVE
Ketones, ur: NEGATIVE mg/dL
Nitrite: NEGATIVE
Protein, ur: NEGATIVE mg/dL
RBC / HPF: 0 RBC/hpf (ref 0–5)
Specific Gravity, Urine: 1.011 (ref 1.005–1.030)
pH: 6 (ref 5.0–8.0)

## 2024-01-26 LAB — WET PREP, GENITAL
Clue Cells Wet Prep HPF POC: NONE SEEN
Sperm: NONE SEEN
Trich, Wet Prep: NONE SEEN
WBC, Wet Prep HPF POC: >=10 — AB (ref <10)
Yeast Wet Prep HPF POC: NONE SEEN

## 2024-01-26 LAB — TYPE AND SCREEN
ABO/RH(D): O POS
Antibody Screen: NEGATIVE

## 2024-01-26 LAB — COMPREHENSIVE METABOLIC PANEL WITH GFR
ALT: 11 U/L (ref 0–44)
AST: 22 U/L (ref 15–41)
Albumin: 3.1 g/dL — ABNORMAL LOW (ref 3.5–5.0)
Alkaline Phosphatase: 101 U/L (ref 38–126)
Anion gap: 9 (ref 5–15)
BUN: 6 mg/dL (ref 6–20)
CO2: 22 mmol/L (ref 22–32)
Calcium: 8.4 mg/dL — ABNORMAL LOW (ref 8.9–10.3)
Chloride: 103 mmol/L (ref 98–111)
Creatinine, Ser: 0.44 mg/dL (ref 0.44–1.00)
GFR, Estimated: >60 mL/min (ref >60)
Glucose, Bld: 100 mg/dL — ABNORMAL HIGH (ref 70–99)
Potassium: 3.8 mmol/L (ref 3.5–5.1)
Sodium: 134 mmol/L — ABNORMAL LOW (ref 135–145)
Total Bilirubin: 0.8 mg/dL (ref 0.0–1.2)
Total Protein: 6.8 g/dL (ref 6.5–8.1)

## 2024-01-26 MED ORDER — LACTATED RINGERS IV BOLUS
1000.0000 mL | Freq: Once | INTRAVENOUS | Status: AC
  Administered 2024-01-26: 1000 mL via INTRAVENOUS

## 2024-01-26 MED ORDER — HYDROXYZINE HCL 25 MG PO TABS: 25.0000 mg | ORAL_TABLET | Freq: Every evening | ORAL | Status: DC | PRN

## 2024-01-26 MED ORDER — ACETAMINOPHEN 500 MG PO TABS: 1000.0000 mg | ORAL_TABLET | Freq: Four times a day (QID) | ORAL | Status: DC | PRN

## 2024-01-26 MED ORDER — CALCIUM CARBONATE ANTACID 500 MG PO CHEW: 2.0000 | CHEWABLE_TABLET | ORAL | Status: DC | PRN

## 2024-01-26 NOTE — H&P (Signed)
 OB History & Physical   History of Present Illness:   Chief Complaint: abdominal pain   HPI:  Bethany Macdonald is a 23 y.o. [id number] female at [redacted]w[redacted]d, Patient's last menstrual period was 05/12/2023 (approximate)., consistent with US  at [redacted]w[redacted]d, with Estimated Date of Delivery: 02/16/24.  She presents to L&D for right sided abominal pain that started around 0900 today. States she feels contractions and cramping but pain feels more centered/noticeable on the right side of her abdomen. Tender to palpation. She has intermittent contractions but a constant feeling of pressure and/or cramping.  Her pregnancy is complicated by fetal growth restriction, poor weight gain, and late prenatal care.  She denies Loss of fluid or Vaginal bleeding. Endorses fetal movement as active.   Reports active fetal movement  Contractions: irregular cramping  LOF/SROM: denies  Vaginal bleeding: denies   Factors complicating pregnancy:  Principal Problem:   Uterine contractions during pregnancy Active Problems:   Abdominal pain affecting pregnancy   Low weight gain during pregnancy in third trimester   Anemia during pregnancy in third trimester   History of preterm delivery   Short interval between pregnancies complicating pregnancy, antepartum   Fetal growth restriction antepartum    Prenatal care site:  [facility]  Patient Active Problem List   Diagnosis Date Noted   Uterine contractions during pregnancy 01/26/2024   Fetal growth restriction antepartum 01/26/2024   Low weight gain during pregnancy in third trimester 11/29/2023   Anemia during pregnancy in third trimester 11/16/2023   Encounter for supervision of normal pregnancy in third trimester 11/15/2023   Late prenatal care 11/15/2023   Short interval between pregnancies complicating pregnancy, antepartum 11/15/2023   Abdominal pain affecting pregnancy 11/24/2022   History of chlamydia 07/13/2022   History of preterm delivery 07/12/2022    Prenatal  Transfer Tool  Maternal Diabetes: No Genetic Screening: Normal Maternal Ultrasounds/Referrals: IUGR Fetal Ultrasounds or other Referrals:  Referred to Materal Fetal Medicine - pending appointment  Maternal Substance Abuse:  No Significant Maternal Medications:  None Significant Maternal Lab Results: Group B Strep negative  Maternal Medical History:   Past Medical History:  Diagnosis Date   Anemia    Hypertension in pregnancy     Past Surgical History:  Procedure Laterality Date   NO PAST SURGERIES      Allergies  Allergen Reactions   Bee Venom     Prior to Admission medications   Medication Sig Start Date End Date Taking? Authorizing Provider  ferrous sulfate  325 (65 FE) MG tablet Take 1 tablet (325 mg total) by mouth 2 (two) times daily with a meal. 12/15/22  Yes Arzella Laurence, CNM  Prenatal Vit-Fe Fumarate-FA (PRENATAL MULTIVITAMIN) TABS tablet Take 1 tablet by mouth daily at 12 noon.   Yes [provider]  albuterol  (VENTOLIN  HFA) 108 (90 Base) MCG/ACT inhaler Inhale 2 puffs into the lungs every 6 (six) hours as needed for wheezing or shortness of breath. Patient not taking: Reported on 01/26/2024 10/12/22   Art Bigness, PA    OB History  Gravida Para Term Preterm AB Living  3 2 0 2 0 2  SAB IAB Ectopic Multiple Live Births  0 0 0 0 2    # Outcome Date GA Lbr Len/2nd Weight Sex Type Anes PTL Lv  3 Current           2 Preterm 12/26/22 [redacted]w[redacted]d  2340 g M Vag-Spont EPI Y LIV  1 Preterm 01/21/21 [redacted]w[redacted]d 07:01 / 00:33 2250 g M Vag-Spont  EPI  LIV     Name: Bethany Macdonald     Apgar1: 9  Apgar5: 9     Social History: She  reports that she has never smoked. She has never used smokeless tobacco. She reports that she does not currently use alcohol. She reports that she does not use drugs.  Family History: family history is not on file.   Review of Systems: A full review of systems was performed and negative except as noted in the HPI.     Physical  Exam:  Vital Signs: BP 106/66   Pulse 95   Temp 98.3 F (36.8 C) (Oral)   Resp 18   LMP 05/12/2023 (Approximate)   General: no acute distress.  HEENT: normocephalic, atraumatic Heart: regular rate & rhythm Lungs: normal respiratory effort Abdomen: soft, gravid, tender to palpation over right lower abdomen, negative McBurney's sign;  EFW: 5 lbs  Pelvic:   External: Normal external female genitalia  Cervix: Dilation: 2 / Effacement (%): 70 / Station: 0    Extremities: non-tender, symmetric, no edema bilaterally.  DTRs: 2+/2+  Neurologic: Alert & oriented x 3.    Results for orders placed or performed during the hospital encounter of 01/26/24 (from the past 24 hours)  Urinalysis, Complete w Microscopic -Urine, Clean Catch     Status: Abnormal   Collection Time: 01/26/24  5:02 PM  Result Value Ref Range   Color, Urine YELLOW (A) YELLOW   APPearance CLEAR (A) CLEAR   Specific Gravity, Urine 1.011 1.005 - 1.030   pH 6.0 5.0 - 8.0   Glucose, UA NEGATIVE NEGATIVE mg/dL   Hgb urine dipstick NEGATIVE NEGATIVE   Bilirubin Urine NEGATIVE NEGATIVE   Ketones, ur NEGATIVE NEGATIVE mg/dL   Protein, ur NEGATIVE NEGATIVE mg/dL   Nitrite NEGATIVE NEGATIVE   Leukocytes,Ua SMALL (A) NEGATIVE   RBC / HPF 0 0 - 5 RBC/hpf   WBC, UA 0-5 0 - 5 WBC/hpf   Bacteria, UA RARE (A) NONE SEEN   Squamous Epithelial / HPF 0-5 0 - 5 /HPF   Mucus PRESENT   Wet prep, genital     Status: Abnormal   Collection Time: 01/26/24  5:02 PM   Specimen: Urine, Clean Catch  Result Value Ref Range   Yeast Wet Prep HPF POC NONE SEEN NONE SEEN   Trich, Wet Prep NONE SEEN NONE SEEN   Clue Cells Wet Prep HPF POC NONE SEEN NONE SEEN   WBC, Wet Prep HPF POC >=10 (A) <10   Sperm NONE SEEN   Chlamydia/NGC rt PCR ([facility] only)     Status: None   Collection Time: 01/26/24  5:02 PM   Specimen: Urine, Clean Catch  Result Value Ref Range   Specimen source GC/Chlam ENDOCERVICAL    Chlamydia Tr NOT DETECTED NOT DETECTED   N  gonorrhoeae NOT DETECTED NOT DETECTED  CBC     Status: Abnormal   Collection Time: 01/26/24  7:30 PM  Result Value Ref Range   WBC 6.1 4.0 - 10.5 K/uL   RBC 3.43 (L) 3.87 - 5.11 MIL/uL   Hemoglobin 9.5 (L) 12.0 - 15.0 g/dL   HCT 16.1 (L) 09.6 - 04.5 %   MCV 82.5 80.0 - 100.0 fL   MCH 27.7 26.0 - 34.0 pg   MCHC 33.6 30.0 - 36.0 g/dL   RDW 40.9 81.1 - 91.4 %   Platelets 207 150 - 400 K/uL   nRBC 0.0 0.0 - 0.2 %  Type and screen [facility]     Status: None   Collection Time: 01/26/24  7:30 PM  Result Value Ref Range   ABO/RH(D) O POS    Antibody Screen NEG    Sample Expiration      01/29/2024,2359 Performed at [facility], [address] [place], [place] [address]   Comprehensive metabolic panel     Status: Abnormal   Collection Time: 01/26/24  7:30 PM  Result Value Ref Range   Sodium 134 (L) 135 - 145 mmol/L   Potassium 3.8 3.5 - 5.1 mmol/L   Chloride 103 98 - 111 mmol/L   CO2 22 22 - 32 mmol/L   Glucose, Bld 100 (H) 70 - 99 mg/dL   BUN 6 6 - 20 mg/dL   Creatinine, Ser 6.04 0.44 - 1.00 mg/dL   Calcium  8.4 (L) 8.9 - 10.3 mg/dL   Total Protein 6.8 6.5 - 8.1 g/dL   Albumin 3.1 (L) 3.5 - 5.0 g/dL   AST 22 15 - 41 U/L   ALT 11 0 - 44 U/L   Alkaline Phosphatase 101 38 - 126 U/L   Total Bilirubin 0.8 0.0 - 1.2 mg/dL   GFR, Estimated >54 >09 mL/min   Anion gap 9 5 - 15    Pertinent Results:  Prenatal Labs: Blood type/Rh O POS   Antibody screen Negative    Rubella Immune (11/15/2023)    Varicella Immune (11/15/2023)  RPR NR (11/15/2023    HBsAg NR (11/15/2023)   Hep C NR (11/15/2023)  HIV Neg (11/15/2023)   GC Neg (01/26/2024)  Chlamydia Neg (01/26/2024)  Genetic screening cfDNA negative   1 hour GTT 86  3 hour GTT N/A  GBS PRESUMPTIVE NEGATIVE/-- (05/05 1806)    FHT:  FHR: 135 bpm, variability: moderate,  accelerations:  Present,  decelerations:  Absent Category/reactivity:  Category I UC:   irregular, every 7-13 minutes   Cephalic by  [place] and SVE   US  OB Limited Result Date: 01/26/2024 CLINICAL DATA:  Fetal growth restriction. Assigned gestational age [redacted] weeks 0 days EXAM: LIMITED OBSTETRIC ULTRASOUND COMPARISON:  Obstetric ultrasound dated 01/16/2024 FINDINGS: Number of Fetuses: 1 Heart Rate:  136 bpm Movement: Yes Presentation: Cephalic Placental Location: Anterior Previa: No Amniotic Fluid (Subjective):  Within normal limits. AFI: 11.6 cm BPD: 8.46 cm 34 w  1 d MATERNAL FINDINGS: Cervix: Not well seen due to advanced gestational age and fetal presentation. Uterus/Adnexae: No abnormality visualized. IMPRESSION: 1. Single active intrauterine pregnancy in cephalic presentation measures 34 weeks 1 day gestation by biparietal diameter. 2. Normal AFI measures 11.6 cm. 3. No sonographic findings of placental abruption. This exam is performed on an emergent basis and does not comprehensively evaluate fetal size, dating, or anatomy; follow-up complete OB US  should be considered if further fetal assessment is warranted. Electronically Signed   By: Limin  Xu M.D.   On: 01/26/2024 19:13   US  OB Comp + 14 Wk Result Date: 01/16/2024 CLINICAL DATA:  Preterm labor EXAM: OBSTETRICAL ULTRASOUND >14 WKS FINDINGS: Number of Fetuses: 1 Heart Rate:  143 bpm Movement: Yes Presentation: Cephalic Previa: No Placental Location: Anterior Amniotic Fluid (Subjective): Normal Amniotic Fluid (Objective): Vertical pocket = 4.5cm AFI = 11.9 cm FETAL BIOMETRY BPD: 8.29cm 33w 2d HC: 31.23cm  35w   0d AC: 29.47cm  33w   3d FL: 6.54cm  33w   5d Current Mean GA: 33w 5d US  EDC: 02/29/2024 Assigned GA:  35w 4d Assigned EDC: 02/16/2024 Estimated Fetal Weight:  2249.2g 8.7%ile FETAL ANATOMY Lateral  Ventricles: Appears normal Thalami/CSP: Appears normal Posterior Fossa:  Not visualized Nuchal Region: Not visualized Upper Lip: Appears normal Spine: Not visualized 4 Chamber Heart on Left: Appears normal LVOT: Not visualized RVOT: Not visualized Stomach on Left: Appears normal 3  Vessel Cord: Appears normal Cord Insertion site: Not visualized Kidneys: Asymmetrically larger right kidney. Otherwise normal appearance. Bladder: Appears normal Extremities: Limited views appear normal Sex: Not Visualized Technically difficult due to: Fetal positioning Maternal Findings: Cervix:  Not well seen due to fetal presentation. IMPRESSION: 1. Single active intrauterine pregnancy with estimated gestational age [redacted] weeks 5 days, 2 weeks behind assigned gestational age of [redacted] weeks 4 days. 2. Estimated fetal weight 8.7 percentile. 3. Fetal anatomic survey as described. Asymmetrically larger right kidney, which may be artifactual related to fetal positioning and technique. Recommend postnatal ultrasound examination for further evaluation. Electronically Signed   By: Limin  Xu M.D.   On: 01/16/2024 11:36    Assessment:  Bethany Macdonald is a 23 y.o. [id number] female at [redacted]w[redacted]d with abdominal pain in pregnancy.   Plan:  1. Admit to Labor & Delivery - Admission status: Observation - Dr Hans Lex MD notified of admission and plan of care  - Reason for admission: observation - consents reviewed and obtained  2. Fetal Well being  - Fetal Tracing: Cat 1 - Group B Streptococcus ppx not indicated: GBS negative - Presentation: cephalic confirmed by US  and SVE    3. Routine OB: - Prenatal labs reviewed, as above - Rh positive - CBC, T&S, RPR on admit - Regular diet, saline lock - continuous fetal monitoring   4. Fetal growth restriction  - US  done 01/16/2024 shows EFW in 8th %tile, normal AFI  - Previous pregnancies complicated by preterm delivery at 35 weeks with first baby weighing 4lbs 15oz and second baby weighing 5lbs 2oz  - She was instructed to follow up with her primary prenatal provider for UAD and antepartum screening. She has not had a prenatal appointment since her last triage visit to [facility] (5/6) and missed her appointment today d/t abdominal pain  - NST is reactive and AFI is normal at 11  cm  - Will need close follow up for UAD and twice weekly antepartum screening if able to discharge home   5. Abdominal pain  - discussed possible etiologies for abdominal pain  - Low suspicion for appendicitis - she is afebrile, no N/V, not acutely ill, no leukocytosis, and negative McBurney's sign  - Discussed evaluation for abruption - frequent uterine irritability seen on toco and patient reports constant cramping. Vaginal screens and UA were negative for infection. Tenderness is localized to right lower abdomen with an anterior placenta. She does not have vaginal bleeding and uterus is soft to palpation. US  overall reassuring with no evidence of abruption.  - Other etiologies include early labor, fetal position, round ligament pain, GI, or musculoskeletal - Plan to observe overnight  - Recheck labs in AM   6. Poor weight gain in pregnancy  - Late prenatal care at 26 weeks - unsure what pre-pregnant weight was  - Short interval pregnancies - last delivery 12/26/2022 - Denies difficulty with access to food or eating, reports good appetite   7. Anemia in pregnancy  - Hgb 9.5 on admission -> 10.2 on 12/15/2023 - consider venofer transfusion  - Continue oral iron supplements  - CBC ordered for AM   7. Post Partum Planning: - Infant feeding: breast and formula feeding - Contraception: Depo-Provera  - Flu vaccine: declined  -  Tdap vaccine: declined  - RSV vaccine: not in season   Teodora Fell, [place] 01/26/24 10:08 PM  Fraser Jackson, CNM Certified Nurse Midwife [place]  [facility]

## 2024-01-26 NOTE — OB Triage Note (Signed)
 23 y.o G3P2 presents to L&D triage for right abdominal pain/contractions and increased vaginal discharge that she noted started around 0900 this morning. Pt admits good fetal movement, no vaginal bleeding, no abdominal pressure. She rates her pain 8/10, hasn't taken any medications for pain. Initial vital signs are wnl. Fetal monitors applied and assessing. Elvis Hamming, CNM aware of pt arrival.

## 2024-01-27 DIAGNOSIS — O26893 Other specified pregnancy related conditions, third trimester: Secondary | ICD-10-CM | POA: Diagnosis not present

## 2024-01-27 LAB — CBC
HCT: 27.1 % — ABNORMAL LOW (ref 36.0–46.0)
Hemoglobin: 9.1 g/dL — ABNORMAL LOW (ref 12.0–15.0)
MCH: 27.8 pg (ref 26.0–34.0)
MCHC: 33.6 g/dL (ref 30.0–36.0)
MCV: 82.9 fL (ref 80.0–100.0)
Platelets: 190 10*3/uL (ref 150–400)
RBC: 3.27 MIL/uL — ABNORMAL LOW (ref 3.87–5.11)
RDW: 13.2 % (ref 11.5–15.5)
WBC: 6.1 10*3/uL (ref 4.0–10.5)
nRBC: 0 % (ref 0.0–0.2)

## 2024-01-27 LAB — COMPREHENSIVE METABOLIC PANEL WITH GFR
ALT: 10 U/L (ref 0–44)
AST: 20 U/L (ref 15–41)
Albumin: 2.7 g/dL — ABNORMAL LOW (ref 3.5–5.0)
Alkaline Phosphatase: 89 U/L (ref 38–126)
Anion gap: 6 (ref 5–15)
BUN: <5 mg/dL — ABNORMAL LOW (ref 6–20)
CO2: 21 mmol/L — ABNORMAL LOW (ref 22–32)
Calcium: 8.5 mg/dL — ABNORMAL LOW (ref 8.9–10.3)
Chloride: 107 mmol/L (ref 98–111)
Creatinine, Ser: 0.36 mg/dL — ABNORMAL LOW (ref 0.44–1.00)
GFR, Estimated: >60 mL/min (ref >60)
Glucose, Bld: 97 mg/dL (ref 70–99)
Potassium: 3.8 mmol/L (ref 3.5–5.1)
Sodium: 134 mmol/L — ABNORMAL LOW (ref 135–145)
Total Bilirubin: 0.7 mg/dL (ref 0.0–1.2)
Total Protein: 6 g/dL — ABNORMAL LOW (ref 6.5–8.1)

## 2024-01-27 LAB — URINE CULTURE: Culture: NO GROWTH

## 2024-01-27 LAB — RPR: RPR Ser Ql: NONREACTIVE

## 2024-01-27 MED ORDER — SODIUM CHLORIDE 0.9 % IV BOLUS: 500.0000 mL | Freq: Once | INTRAVENOUS | Status: DC | PRN

## 2024-01-27 MED ORDER — DIPHENHYDRAMINE HCL 50 MG/ML IJ SOLN: 25.0000 mg | Freq: Once | INTRAMUSCULAR | Status: DC | PRN

## 2024-01-27 MED ORDER — EPINEPHRINE 0.3 MG/0.3ML IJ SOAJ: 0.3000 mg | Freq: Once | INTRAMUSCULAR | Status: DC | PRN

## 2024-01-27 MED ORDER — METHYLPREDNISOLONE SODIUM SUCC 125 MG IJ SOLR: 125.0000 mg | Freq: Once | INTRAMUSCULAR | Status: DC | PRN

## 2024-01-27 MED ORDER — ALBUTEROL SULFATE (2.5 MG/3ML) 0.083% IN NEBU: 2.5000 mg | INHALATION_SOLUTION | Freq: Once | RESPIRATORY_TRACT | Status: DC | PRN

## 2024-01-27 MED ORDER — SODIUM CHLORIDE 0.9 % IV SOLN: INTRAVENOUS | Status: DC | PRN

## 2024-01-27 MED ORDER — IRON SUCROSE 300 MG IVPB - SIMPLE MED
300.0000 mg | Freq: Once | Status: AC
  Administered 2024-01-27: 300 mg via INTRAVENOUS
  Filled 2024-01-27: qty 300

## 2024-01-27 NOTE — Discharge Summary (Signed)
 Patient ID: Bethany Macdonald MRN: [id number] DOB/AGE: [birth year] 22 y.o.  Admit date: 01/26/2024 Discharge date: 01/27/2024  Admission Diagnoses: Bethany Macdonald is a 23 y.o. [id number] female admitted at [redacted]w[redacted]d for observation of abdominal pain in pregnancy.   Discharge Diagnoses: Bethany Macdonald is a 23 y.o. [phone] female was deemed stable for discharge at [redacted]w[redacted]d.   Factors complicating pregnancy: Uterine contractions during pregnancy Abdominal pain affecting pregnancy Low weight gain during pregnancy in third trimester Anemia during pregnancy in third trimester History of preterm delivery Short interval between pregnancies complicating pregnancy, antepartum Fetal growth restriction   Prenatal Procedures: NST and Ultrasound   Significant Diagnostic Studies:  Results for orders placed or performed during the hospital encounter of 01/26/24 (from the past week)  Wet prep, genital   Collection Time: 01/26/24  5:02 PM   Specimen: Urine, Clean Catch  Result Value Ref Range   Yeast Wet Prep HPF POC NONE SEEN NONE SEEN   Trich, Wet Prep NONE SEEN NONE SEEN   Clue Cells Wet Prep HPF POC NONE SEEN NONE SEEN   WBC, Wet Prep HPF POC >=10 (A) <10   Sperm NONE SEEN   Chlamydia/NGC rt PCR ([facility] only)   Collection Time: 01/26/24  5:02 PM   Specimen: Urine, Clean Catch  Result Value Ref Range   Specimen source GC/Chlam ENDOCERVICAL    Chlamydia Tr NOT DETECTED NOT DETECTED   N gonorrhoeae NOT DETECTED NOT DETECTED  Urinalysis, Complete w Microscopic -Urine, Clean Catch   Collection Time: 01/26/24  5:02 PM  Result Value Ref Range   Color, Urine YELLOW (A) YELLOW   APPearance CLEAR (A) CLEAR   Specific Gravity, Urine 1.011 1.005 - 1.030   pH 6.0 5.0 - 8.0   Glucose, UA NEGATIVE NEGATIVE mg/dL   Hgb urine dipstick NEGATIVE NEGATIVE   Bilirubin Urine NEGATIVE NEGATIVE   Ketones, ur NEGATIVE NEGATIVE mg/dL   Protein, ur NEGATIVE NEGATIVE mg/dL   Nitrite NEGATIVE NEGATIVE   Leukocytes,Ua SMALL  (A) NEGATIVE   RBC / HPF 0 0 - 5 RBC/hpf   WBC, UA 0-5 0 - 5 WBC/hpf   Bacteria, UA RARE (A) NONE SEEN   Squamous Epithelial / HPF 0-5 0 - 5 /HPF   Mucus PRESENT   CBC   Collection Time: 01/26/24  7:30 PM  Result Value Ref Range   WBC 6.1 4.0 - 10.5 K/uL   RBC 3.43 (L) 3.87 - 5.11 MIL/uL   Hemoglobin 9.5 (L) 12.0 - 15.0 g/dL   HCT 14.7 (L) 82.9 - 56.2 %   MCV 82.5 80.0 - 100.0 fL   MCH 27.7 26.0 - 34.0 pg   MCHC 33.6 30.0 - 36.0 g/dL   RDW 13.0 86.5 - 78.4 %   Platelets 207 150 - 400 K/uL   nRBC 0.0 0.0 - 0.2 %  Comprehensive metabolic panel   Collection Time: 01/26/24  7:30 PM  Result Value Ref Range   Sodium 134 (L) 135 - 145 mmol/L   Potassium 3.8 3.5 - 5.1 mmol/L   Chloride 103 98 - 111 mmol/L   CO2 22 22 - 32 mmol/L   Glucose, Bld 100 (H) 70 - 99 mg/dL   BUN 6 6 - 20 mg/dL   Creatinine, Ser 6.96 0.44 - 1.00 mg/dL   Calcium  8.4 (L) 8.9 - 10.3 mg/dL   Total Protein 6.8 6.5 - 8.1 g/dL   Albumin 3.1 (L) 3.5 - 5.0 g/dL   AST 22 15 - 41 U/L   ALT 11 0 -  44 U/L   Alkaline Phosphatase 101 38 - 126 U/L   Total Bilirubin 0.8 0.0 - 1.2 mg/dL   GFR, Estimated >96 >04 mL/min   Anion gap 9 5 - 15  RPR   Collection Time: 01/26/24  7:30 PM  Result Value Ref Range   RPR Ser Ql NON REACTIVE NON REACTIVE  Type and screen [facility]   Collection Time: 01/26/24  7:30 PM  Result Value Ref Range   ABO/RH(D) O POS    Antibody Screen NEG    Sample Expiration      01/29/2024,2359 Performed at [facility], [address] [place], [place] [address]   CBC   Collection Time: 01/27/24  5:20 AM  Result Value Ref Range   WBC 6.1 4.0 - 10.5 K/uL   RBC 3.27 (L) 3.87 - 5.11 MIL/uL   Hemoglobin 9.1 (L) 12.0 - 15.0 g/dL   HCT 11.9 (L) 14.7 - 82.9 %   MCV 82.9 80.0 - 100.0 fL   MCH 27.8 26.0 - 34.0 pg   MCHC 33.6 30.0 - 36.0 g/dL   RDW 56.2 13.0 - 86.5 %   Platelets 190 150 - 400 K/uL   nRBC 0.0 0.0 - 0.2 %  Comprehensive metabolic panel   Collection  Time: 01/27/24  5:20 AM  Result Value Ref Range   Sodium 134 (L) 135 - 145 mmol/L   Potassium 3.8 3.5 - 5.1 mmol/L   Chloride 107 98 - 111 mmol/L   CO2 21 (L) 22 - 32 mmol/L   Glucose, Bld 97 70 - 99 mg/dL   BUN <5 (L) 6 - 20 mg/dL   Creatinine, Ser 7.84 (L) 0.44 - 1.00 mg/dL   Calcium  8.5 (L) 8.9 - 10.3 mg/dL   Total Protein 6.0 (L) 6.5 - 8.1 g/dL   Albumin 2.7 (L) 3.5 - 5.0 g/dL   AST 20 15 - 41 U/L   ALT 10 0 - 44 U/L   Alkaline Phosphatase 89 38 - 126 U/L   Total Bilirubin 0.7 0.0 - 1.2 mg/dL   GFR, Estimated >69 >62 mL/min   Anion gap 6 5 - 15    Treatments: IV hydration, IV Venofer 300 mg   Hospital Course:  This is a 23 y.o. [id number] with IUP at [redacted]w[redacted]d admitted for abdominal pain in pregnancy. She was observed overnight for approximately 24 hours. She reports her abdominal pain went from an "8" to a "6". Describes contractions to be mild. Declined Tylenol  during entire hospital course. Denies leakage of fluid and vaginal bleeding. Reports active fetal movement. She received IV Venofer 300 mg for low Hgb (9.1) on 01/27/2024. She was observed, fetal heart rate monitoring remained reassuring, and she had no signs/symptoms of progressing preterm labor or other maternal-fetal concerns. Her cervical exam was unchanged from admission.  She was deemed stable for discharge to home with outpatient follow up.  Discharge Physical Exam:  BP (!) 102/57 (BP Location: Left Arm)   Pulse 87   Temp 98.4 F (36.9 C) (Oral)   Resp 16   LMP 05/12/2023 (Approximate)   SpO2 98%   General: NAD CV: RRR Pulm: CTABL, nl effort ABD: s/nd/nt, gravid DVT Evaluation: no evidence of DVT on exam.  NST: FHR baseline: 140 bpm Variability: moderate Accelerations: yes Decelerations: none Time: approximately 24+ hours  Category/reactivity: reactive  TOCO: Irregular. Contractions approximately q 6-14 mins on the morning of 05/17 SVE:  Dilation: 2 Effacement (%): 70 Cervical Position:  Posterior Station: 0  Presentation: Vertex Exam by:: Keron Neenan CNM  Plan: - Patient advised to continue to take her ferrous sulfate  325 mg PO as prescribed by    her provider to increase her iron levels. Patient verbalized understanding.  - Patient recommended that OTC Tylenol  1,000 mg q6hrs, heating pad applied to area    of discomfort (on lowest setting), increase fluid intake, and rest may help manage    symptoms.  - Schedule follow-up appointment with primary OB/GYN.  - Strict return precautions given and patient instructed to follow-up as needed.   Discharge Condition: Stable  Disposition:  Discharge disposition: 01-Home or Self Care        Allergies as of 01/27/2024       Reactions   Bee Venom         Medication List     STOP taking these medications    albuterol  108 (90 Base) MCG/ACT inhaler Commonly known as: VENTOLIN  HFA       TAKE these medications    ferrous sulfate  325 (65 FE) MG tablet Take 1 tablet (325 mg total) by mouth 2 (two) times daily with a meal.   prenatal multivitamin Tabs tablet Take 1 tablet by mouth daily at 12 noon.         Signed:  Rainy Rothman, CNM 01/27/2024 3:18 PM

## 2024-01-27 NOTE — Discharge Instructions (Signed)
LABOR: When contractions begin, you should start to time them from the beginning of one contraction to the beginning of the next.  When contractions are 5-10 minutes apart or less and have been regular for at least an hour, you should call your health care provider. ° °Notify your doctor if any of the following occur: °1. Bleeding from the vagina 7. Sudden, constant, or occasional abdominal pain  °2. Pain or burning when urinating 8. Sudden gushing of fluid from the vagina (with or without continued leaking)  °3. Chills or fever 9. Fainting spells, "black outs" or loss of consciousness  °4. Increase in vaginal discharge 10. Severe or continued nausea or vomiting  °5. Pelvic pressure (sudden increase) 11. Blurring of vision or spots before the eyes  °6. Baby moving less than usual 12. Leaking of fluid  ° ° °FETAL KICK COUNT: °Lie on your left side for one hour after a meal, and count the number of times your baby kicks. If it is less than 5 times, get up, move around and drink some juice. Repeat the test 30 minutes later. If it is still less than 5 kicks in an hour, notify your doctor. °

## 2024-01-27 NOTE — OB Triage Note (Signed)
 Patient given discharge instructions as per AVS. All questions answered. Patient and significant other verbalized understanding. Patient discharged, ambulatory, in stable condition accompanied by significant other.

## 2024-02-04 ENCOUNTER — Encounter: Payer: Self-pay | Admitting: Obstetrics and Gynecology

## 2024-02-04 ENCOUNTER — Other Ambulatory Visit: Payer: Self-pay

## 2024-02-04 ENCOUNTER — Inpatient Hospital Stay
Admission: EM | Admit: 2024-02-04 | Discharge: 2024-02-05 | DRG: 807 | Disposition: A | Discharge status: Home / Self Care | Admitting: Obstetrics and Gynecology

## 2024-02-04 DIAGNOSIS — O26893 Other specified pregnancy related conditions, third trimester: Secondary | ICD-10-CM | POA: Diagnosis present

## 2024-02-04 DIAGNOSIS — Z3A38 38 weeks gestation of pregnancy: Secondary | ICD-10-CM | POA: Diagnosis not present

## 2024-02-04 DIAGNOSIS — Z8619 Personal history of other infectious and parasitic diseases: Secondary | ICD-10-CM | POA: Diagnosis present

## 2024-02-04 DIAGNOSIS — O99013 Anemia complicating pregnancy, third trimester: Secondary | ICD-10-CM | POA: Diagnosis present

## 2024-02-04 DIAGNOSIS — O9902 Anemia complicating childbirth: Secondary | ICD-10-CM | POA: Diagnosis present

## 2024-02-04 DIAGNOSIS — O2613 Low weight gain in pregnancy, third trimester: Secondary | ICD-10-CM | POA: Diagnosis present

## 2024-02-04 DIAGNOSIS — O365990 Maternal care for other known or suspected poor fetal growth, unspecified trimester, not applicable or unspecified: Secondary | ICD-10-CM | POA: Diagnosis present

## 2024-02-04 DIAGNOSIS — O09899 Supervision of other high risk pregnancies, unspecified trimester: Secondary | ICD-10-CM

## 2024-02-04 DIAGNOSIS — Z8751 Personal history of pre-term labor: Secondary | ICD-10-CM

## 2024-02-04 DIAGNOSIS — O365930 Maternal care for other known or suspected poor fetal growth, third trimester, not applicable or unspecified: Secondary | ICD-10-CM | POA: Diagnosis present

## 2024-02-04 DIAGNOSIS — O0930 Supervision of pregnancy with insufficient antenatal care, unspecified trimester: Secondary | ICD-10-CM

## 2024-02-04 LAB — CBC
HCT: 32.9 % — ABNORMAL LOW (ref 36.0–46.0)
Hemoglobin: 10.7 g/dL — ABNORMAL LOW (ref 12.0–15.0)
MCH: 27.6 pg (ref 26.0–34.0)
MCHC: 32.5 g/dL (ref 30.0–36.0)
MCV: 85 fL (ref 80.0–100.0)
Platelets: 193 10*3/uL (ref 150–400)
RBC: 3.87 MIL/uL (ref 3.87–5.11)
RDW: 14.8 % (ref 11.5–15.5)
WBC: 6.9 10*3/uL (ref 4.0–10.5)
nRBC: 0 % (ref 0.0–0.2)

## 2024-02-04 LAB — TYPE AND SCREEN
ABO/RH(D): O POS
Antibody Screen: NEGATIVE

## 2024-02-04 MED ORDER — IBUPROFEN 600 MG PO TABS
600.0000 mg | ORAL_TABLET | Freq: Four times a day (QID) | ORAL | Status: DC
  Administered 2024-02-04 – 2024-02-05 (×4): 600 mg via ORAL
  Filled 2024-02-04 (×4): qty 1

## 2024-02-04 MED ORDER — OXYTOCIN-SODIUM CHLORIDE 30-0.9 UT/500ML-% IV SOLN: 2.5000 [IU]/h | INTRAVENOUS | Status: DC

## 2024-02-04 MED ORDER — SODIUM CHLORIDE 0.9% FLUSH: 3.0000 mL | Freq: Two times a day (BID) | INTRAVENOUS | Status: DC

## 2024-02-04 MED ORDER — LIDOCAINE HCL (PF) 1 % IJ SOLN: 30.0000 mL | INTRAMUSCULAR | Status: DC | PRN

## 2024-02-04 MED ORDER — ONDANSETRON HCL 4 MG/2ML IJ SOLN: 4.0000 mg | Freq: Four times a day (QID) | INTRAMUSCULAR | Status: DC | PRN

## 2024-02-04 MED ORDER — ONDANSETRON HCL 4 MG/2ML IJ SOLN: 4.0000 mg | INTRAMUSCULAR | Status: DC | PRN

## 2024-02-04 MED ORDER — COCONUT OIL OIL: 1.0000 | TOPICAL_OIL | Status: DC | PRN

## 2024-02-04 MED ORDER — SENNOSIDES-DOCUSATE SODIUM 8.6-50 MG PO TABS
2.0000 | ORAL_TABLET | Freq: Every day | ORAL | Status: DC
  Administered 2024-02-05: 2 via ORAL
  Filled 2024-02-04: qty 2

## 2024-02-04 MED ORDER — LACTATED RINGERS IV SOLN: 500.0000 mL | INTRAVENOUS | Status: DC | PRN

## 2024-02-04 MED ORDER — AMMONIA AROMATIC IN INHA
RESPIRATORY_TRACT | Status: AC
  Filled 2024-02-04: qty 10

## 2024-02-04 MED ORDER — DIPHENHYDRAMINE HCL 25 MG PO CAPS: 25.0000 mg | ORAL_CAPSULE | Freq: Four times a day (QID) | ORAL | Status: DC | PRN

## 2024-02-04 MED ORDER — SIMETHICONE 80 MG PO CHEW: 80.0000 mg | CHEWABLE_TABLET | ORAL | Status: DC | PRN

## 2024-02-04 MED ORDER — OXYTOCIN 10 UNIT/ML IJ SOLN
INTRAMUSCULAR | Status: AC
  Administered 2024-02-04: 10 [IU] via INTRAMUSCULAR
  Filled 2024-02-04: qty 1

## 2024-02-04 MED ORDER — ACETAMINOPHEN 500 MG PO TABS: 1000.0000 mg | ORAL_TABLET | Freq: Four times a day (QID) | ORAL | Status: DC | PRN

## 2024-02-04 MED ORDER — DIBUCAINE (PERIANAL) 1 % EX OINT: 1.0000 | TOPICAL_OINTMENT | CUTANEOUS | Status: DC | PRN

## 2024-02-04 MED ORDER — ONDANSETRON HCL 4 MG PO TABS: 4.0000 mg | ORAL_TABLET | ORAL | Status: DC | PRN

## 2024-02-04 MED ORDER — SODIUM CHLORIDE 0.9% FLUSH: 3.0000 mL | INTRAVENOUS | Status: DC | PRN

## 2024-02-04 MED ORDER — ZOLPIDEM TARTRATE 5 MG PO TABS: 5.0000 mg | ORAL_TABLET | Freq: Every evening | ORAL | Status: DC | PRN

## 2024-02-04 MED ORDER — OXYTOCIN BOLUS FROM INFUSION: 333.0000 mL | Freq: Once | INTRAVENOUS | Status: DC

## 2024-02-04 MED ORDER — SODIUM CHLORIDE 0.9 % IV SOLN: 250.0000 mL | INTRAVENOUS | Status: DC | PRN

## 2024-02-04 MED ORDER — ACETAMINOPHEN 500 MG PO TABS
1000.0000 mg | ORAL_TABLET | Freq: Four times a day (QID) | ORAL | Status: DC
  Administered 2024-02-04 – 2024-02-05 (×4): 1000 mg via ORAL
  Filled 2024-02-04 (×4): qty 2

## 2024-02-04 MED ORDER — FERROUS SULFATE 325 (65 FE) MG PO TABS
325.0000 mg | ORAL_TABLET | Freq: Two times a day (BID) | ORAL | Status: DC
  Administered 2024-02-05: 325 mg via ORAL
  Filled 2024-02-04: qty 1

## 2024-02-04 MED ORDER — LIDOCAINE HCL (PF) 1 % IJ SOLN
INTRAMUSCULAR | Status: AC
  Filled 2024-02-04: qty 30

## 2024-02-04 MED ORDER — WITCH HAZEL-GLYCERIN EX PADS
1.0000 | MEDICATED_PAD | CUTANEOUS | Status: DC | PRN
  Administered 2024-02-04: 1 via TOPICAL
  Filled 2024-02-04: qty 100

## 2024-02-04 MED ORDER — LACTATED RINGERS IV SOLN: INTRAVENOUS | Status: DC

## 2024-02-04 MED ORDER — MISOPROSTOL 200 MCG PO TABS
ORAL_TABLET | ORAL | Status: AC
  Filled 2024-02-04: qty 4

## 2024-02-04 MED ORDER — BENZOCAINE-MENTHOL 20-0.5 % EX AERO
1.0000 | INHALATION_SPRAY | CUTANEOUS | Status: DC | PRN
  Administered 2024-02-04: 1 via TOPICAL
  Filled 2024-02-04: qty 56

## 2024-02-04 MED ORDER — PRENATAL MULTIVITAMIN CH
1.0000 | ORAL_TABLET | Freq: Every day | ORAL | Status: DC
  Administered 2024-02-05: 1 via ORAL
  Filled 2024-02-04: qty 1

## 2024-02-04 MED ORDER — FENTANYL CITRATE (PF) 100 MCG/2ML IJ SOLN: 50.0000 ug | INTRAMUSCULAR | Status: DC | PRN

## 2024-02-04 MED ORDER — OXYTOCIN 10 UNIT/ML IJ SOLN: 10.0000 [IU] | Freq: Once | INTRAMUSCULAR | Status: AC

## 2024-02-04 MED ORDER — SOD CITRATE-CITRIC ACID 500-334 MG/5ML PO SOLN: 30.0000 mL | ORAL | Status: DC | PRN

## 2024-02-04 NOTE — Discharge Summary (Signed)
 Postpartum Discharge Summary  Patient Name: Bethany Macdonald DOB: 2001/03/12 MRN: [id number]  Date of admission: 02/04/2024 Delivery date:02/04/2024 Delivering provider: MACKIE, ANNA M Date of discharge: 02/05/2024  Primary OB: [facility]  [id number] last menstrual period was 05/12/2023 (approximate). EDC Estimated Date of Delivery: 02/16/24 Gestational Age at Delivery: [redacted]w[redacted]d   Admitting diagnosis: Normal labor [O80, Z37.9] Intrauterine pregnancy: [redacted]w[redacted]d     Secondary diagnosis:   Principal Problem:   NSVD (normal spontaneous vaginal delivery) Active Problems:   Low weight gain during pregnancy in third trimester   Anemia during pregnancy in third trimester   History of chlamydia   History of preterm delivery   Late prenatal care   Short interval between pregnancies complicating pregnancy, antepartum   Fetal growth restriction antepartum   Normal labor   SGA (small for gestational age)   Precipitous delivery   Discharge Diagnosis: Term Pregnancy Delivered      Hospital course: Onset of Labor With Vaginal Delivery      23 y.o. yo [id number] at [redacted]w[redacted]d was admitted in Active Labor on 02/04/2024. Labor course was complicated by precipitous delivery. Jenesis presented to L&D in advanced labor and urge to push. Cervical exam on arrival was C/C/+2 with bulging bag of water. She was moved to labor room for immanent delivery. She pushed quickly over approximately 10 minutes for a spontaneous vaginal birth.  Membrane Rupture Time/Date: 10:17 AM,02/04/2024  Delivery Method:Vaginal, Spontaneous Operative Delivery:N/A Episiotomy: None Lacerations:  None Patient had a postpartum course complicated by none.  She is ambulating, tolerating a regular diet, passing flatus, and urinating well. Patient is discharged home in stable condition on 02/05/24.  Newborn Data: Birth date:02/04/2024 Birth time:10:19 AM Gender:Female Living status:Living Apgars:8 ,9  Weight:2660 g                                             Post partum procedures:none Augmentation:: N/A Complications: Precipitous delivery  Delivery Type: spontaneous vaginal delivery Anesthesia: None  Placenta: spontaneous To Pathology: No   Prenatal Labs:  Blood type/Rh O pos  Antibody screen neg  Rubella Immune  Varicella Immune  RPR NR  HBsAg Neg  HIV NR  GC neg  Chlamydia neg  Genetic screening negative  1 hour GTT 86  3 hour GTT    GBS Negative     Magnesium  Sulfate received: No BMZ received: No Rhophylac:was not indicated MMR: was not indicated Varivax vaccine given: was not indicated - Tdap vaccine: declined  - Flu vaccine: declined  - RSV vaccine: Not in season   Transfusion:No   Physical exam  Vitals:   02/04/24 1911 02/04/24 2302 02/05/24 0307 02/05/24 0809  BP: 105/75 107/72 106/74 97/70  Pulse: 75 77 71 69  Resp: 16 16 16 17   Temp: 98.7 F (37.1 C) 98.4 F (36.9 C) 98.4 F (36.9 C) 97.7 F (36.5 C)  TempSrc: Oral Oral Oral Oral  SpO2: 97% 96% 98% 97%  Weight:      Height:       General: alert, cooperative, and no distress Lochia: appropriate Uterine Fundus: firm Perineum: minimal edema/intact DVT Evaluation: No evidence of DVT seen on physical exam.  Labs: Lab Results  Component Value Date   WBC 8.7 02/05/2024   HGB 9.9 (L) 02/05/2024   HCT 31.0 (L) 02/05/2024   MCV 85.6 02/05/2024   PLT 195 02/05/2024  Latest Ref Rng & Units 01/27/2024    5:20 AM  CMP  Glucose 70 - 99 mg/dL 97   BUN 6 - 20 mg/dL <5   Creatinine 1.61 - 1.00 mg/dL 0.96   Sodium 045 - 409 mmol/L 134   Potassium 3.5 - 5.1 mmol/L 3.8   Chloride 98 - 111 mmol/L 107   CO2 22 - 32 mmol/L 21   Calcium  8.9 - 10.3 mg/dL 8.5   Total Protein 6.5 - 8.1 g/dL 6.0   Total Bilirubin 0.0 - 1.2 mg/dL 0.7   Alkaline Phos 38 - 126 U/L 89   AST 15 - 41 U/L 20   ALT 0 - 44 U/L 10    Edinburgh Score:    02/05/2024    5:14 AM  Edinburgh Postnatal Depression Scale Screening Tool  I have been able to laugh and see the  funny side of things. 0  I have looked forward with enjoyment to things. 0  I have blamed myself unnecessarily when things went wrong. 0  I have been anxious or worried for no good reason. 0  I have felt scared or panicky for no good reason. 0  Things have been getting on top of me. 0  I have been so unhappy that I have had difficulty sleeping. 0  I have felt sad or miserable. 0  I have been so unhappy that I have been crying. 0  The thought of harming myself has occurred to me. 0  Edinburgh Postnatal Depression Scale Total 0    Risk assessment for postpartum VTE and prophylactic treatment: Very high risk factors: None High risk factors: None Moderate risk factors: None  Postpartum VTE prophylaxis with LMWH not indicated  After visit meds:  Allergies as of 02/05/2024       Reactions   Bee Venom         Medication List     TAKE these medications    ferrous sulfate  325 (65 FE) MG tablet Take 1 tablet (325 mg total) by mouth 2 (two) times daily with a meal.   prenatal multivitamin Tabs tablet Take 1 tablet by mouth daily at 12 noon.       Discharge home in stable condition Infant Feeding: Bottle Infant Disposition:home with mother Discharge instruction: per After Visit Summary and Postpartum booklet. Activity: Advance as tolerated. Pelvic rest for 6 weeks.  Diet: routine diet Anticipated Birth Control:  Contraceptives: Depo Provera  Postpartum Appointment:6 weeks Additional Postpartum F/U: none Future Appointments:No future appointments. Follow up Visit:  Follow-up Information     Teodora Fell, CNM Follow up in 6 week(s).   Specialty: Certified Nurse Midwife Why: postpartum visit. Can follow up with [facility] if preferred. Contact information: [address] [place] [address] [phone]                 Plan:  Delorice Bannister was discharged to home in good condition. Follow-up appointment as directed.    Signed: Aron Lard 02/05/2024 1:55 PM

## 2024-02-04 NOTE — Discharge Instructions (Signed)
 Vaginal Delivery, Care After Refer to this sheet in the next few weeks. These discharge instructions provide you with information on caring for yourself after delivery. Your caregiver may also give you specific instructions. Your treatment has been planned according to the most current medical practices available, but problems sometimes occur. Call your caregiver if you have any problems or questions after you go home. HOME CARE INSTRUCTIONS Take over-the-counter or prescription medicines only as directed by your caregiver or pharmacist. Do not drink alcohol, especially if you are breastfeeding or taking medicine to relieve pain. Do not smoke tobacco. Continue to use good perineal care. Good perineal care includes: Wiping your perineum from back to front Keeping your perineum clean. You can do sitz baths twice a day, to help keep this area clean Do not use tampons, douche or have sex until your caregiver says it is okay. Shower only and avoid sitting in submerged water, aside from sitz baths Wear a well-fitting bra that provides breast support. Eat healthy foods. Drink enough fluids to keep your urine clear or pale yellow. Eat high-fiber foods such as whole grain cereals and breads, brown rice, beans, and fresh fruits and vegetables every day. These foods may help prevent or relieve constipation. Avoid constipation with high fiber foods or medications, such as miralax or metamucil Follow your caregiver's recommendations regarding resumption of activities such as climbing stairs, driving, lifting, exercising, or traveling. Talk to your caregiver about resuming sexual activities. Resumption of sexual activities is dependent upon your risk of infection, your rate of healing, and your comfort and desire to resume sexual activity. Try to have someone help you with your household activities and your newborn for at least a few days after you leave the hospital. Rest as much as possible. Try to rest or  take a nap when your newborn is sleeping. Increase your activities gradually. Keep all of your scheduled postpartum appointments. It is very important to keep your scheduled follow-up appointments. At these appointments, your caregiver will be checking to make sure that you are healing physically and emotionally. SEEK MEDICAL CARE IF:  You are passing large clots from your vagina. Save any clots to show your caregiver. You have a foul smelling discharge from your vagina. You have trouble urinating. You are urinating frequently. You have pain when you urinate. You have a change in your bowel movements. You have increasing redness, pain, or swelling near your vaginal incision (episiotomy) or vaginal tear. You have pus draining from your episiotomy or vaginal tear. Your episiotomy or vaginal tear is separating. You have painful, hard, or reddened breasts. You have a severe headache. You have blurred vision or see spots. You feel sad or depressed. You have thoughts of hurting yourself or your newborn. You have questions about your care, the care of your newborn, or medicines. You are dizzy or light-headed. You have a rash. You have nausea or vomiting. You were breastfeeding and have not had a menstrual period within 12 weeks after you stopped breastfeeding. You are not breastfeeding and have not had a menstrual period by the 12th week after delivery. You have a fever. SEEK IMMEDIATE MEDICAL CARE IF:  You have persistent pain. You have chest pain. You have shortness of breath. You faint. You have leg pain. You have stomach pain. Your vaginal bleeding saturates two or more sanitary pads in 1 hour. MAKE SURE YOU:  Understand these instructions. Will watch your condition. Will get help right away if you are not doing well or  get worse. Document Released: 08/26/2000 Document Revised: 01/13/2014 Document Reviewed: 04/25/2012 [facility] Patient Information 2015 [place], [place]. This  information is not intended to replace advice given to you by your health care provider. Make sure you discuss any questions you have with your health care provider.  Sitz Bath A sitz bath is a warm water bath taken in the sitting position. The water covers only the hips and butt (buttocks). We recommend using one that fits in the toilet, to help with ease of use and cleanliness. It may be used for either healing or cleaning purposes. Sitz baths are also used to relieve pain, itching, or muscle tightening (spasms). The water may contain medicine. Moist heat will help you heal and relax.  HOME CARE  Take 3 to 4 sitz baths a day. Fill the bathtub half-full with warm water. Sit in the water and open the drain a little. Turn on the warm water to keep the tub half-full. Keep the water running constantly. Soak in the water for 15 to 20 minutes. After the sitz bath, pat the affected area dry. GET HELP RIGHT AWAY IF: You get worse instead of better. Stop the sitz baths if you get worse. MAKE SURE YOU: Understand these instructions. Will watch your condition. Will get help right away if you are not doing well or get worse. Document Released: 10/06/2004 Document Revised: 05/23/2012 Document Reviewed: 12/27/2010 [facility] Patient Information 2015 [place], [place]. This information is not intended to replace advice given to you by your health care provider. Make sure you discuss any questions you have with your health care provider.

## 2024-02-04 NOTE — H&P (Signed)
 OB History & Physical   History of Present Illness:   Chief Complaint: urge to push and regular strong contractions  HPI:  Bethany Macdonald is a 23 y.o. [id number] female at [redacted]w[redacted]d, Patient's last menstrual period was 05/12/2023 (approximate)., consistent with US  at [redacted]w[redacted]d, with Estimated Date of Delivery: 02/16/24.  She presents to L&D for advance labor and urge to push. Her pregnancy is complicated by history of preterm delivery at 35 weeks x 2, late prenatal care, poor weight gain in pregnancy, short interval between pregnancies, anemia in pregnancy.  She denies Loss of fluid or Vaginal bleeding. Endorses fetal movement as active.   Reports active fetal movement  Contractions: every 2 to 3 minutes LOF/SROM: denies  Vaginal bleeding: denies   Factors complicating pregnancy:  Principal Problem:   Normal labor Active Problems:   NSVD (normal spontaneous vaginal delivery)   Low weight gain during pregnancy in third trimester   Anemia during pregnancy in third trimester   History of chlamydia   History of preterm delivery   Late prenatal care   Short interval between pregnancies complicating pregnancy, antepartum   Fetal growth restriction antepartum   SGA (small for gestational age)   Precipitous delivery    Prenatal care site:  [facility]   Patient Active Problem List   Diagnosis Date Noted   Normal labor 02/04/2024   Precipitous delivery 02/04/2024   SGA (small for gestational age) 02/02/2024   Fetal growth restriction antepartum 01/26/2024   Low weight gain during pregnancy in third trimester 11/29/2023   Anemia during pregnancy in third trimester 11/16/2023   Late prenatal care 11/15/2023   Short interval between pregnancies complicating pregnancy, antepartum 11/15/2023   Encounter for supervision of normal pregnancy in third trimester 11/15/2023   History of chlamydia 07/13/2022   History of preterm delivery 07/12/2022   NSVD (normal spontaneous vaginal delivery) 01/21/2021     Prenatal Transfer Tool  Maternal Diabetes: No Genetic Screening: Normal Maternal Ultrasounds/Referrals: IUGR Fetal Ultrasounds or other Referrals:  Referred to Materal Fetal Medicine  Maternal Substance Abuse:  No Significant Maternal Medications:  None Significant Maternal Lab Results: Group B Strep negative  Maternal Medical History:   Past Medical History:  Diagnosis Date   Anemia    Hypertension in pregnancy     Past Surgical History:  Procedure Laterality Date   NO PAST SURGERIES      Allergies  Allergen Reactions   Bee Venom     Prior to Admission medications   Medication Sig Start Date End Date Taking? Authorizing Provider  ferrous sulfate  325 (65 FE) MG tablet Take 1 tablet (325 mg total) by mouth 2 (two) times daily with a meal. 12/15/22   Arzella Laurence, CNM  Prenatal Vit-Fe Fumarate-FA (PRENATAL MULTIVITAMIN) TABS tablet Take 1 tablet by mouth daily at 12 noon.    [provider]    OB History  Gravida Para Term Preterm AB Living  3 2 0 2 0 2  SAB IAB Ectopic Multiple Live Births  0 0 0 0 2    # Outcome Date GA Lbr Len/2nd Weight Sex Type Anes PTL Lv  3 Current           2 Preterm 12/26/22 [redacted]w[redacted]d  2340 g M Vag-Spont EPI Y LIV  1 Preterm 01/21/21 [redacted]w[redacted]d 07:01 / 00:33 2250 g M Vag-Spont EPI  LIV     Name: [name]     Apgar1: 9  Apgar5: 9     Social History: She  reports that she has never smoked. She has never used smokeless tobacco. She reports that she does not currently use alcohol. She reports that she does not use drugs.  Family History: family history is not on file.   Review of Systems: A full review of systems was performed and negative except as noted in the HPI.     Physical Exam:  Vital Signs: BP 117/84   Pulse 73   Temp 98.5 F (36.9 C) (Oral)   LMP 05/12/2023 (Approximate)   General: no acute distress.  HEENT: normocephalic, atraumatic Heart: regular rate & rhythm Lungs: normal respiratory effort Abdomen:  soft, gravid, non-tender;  EFW: 5 1/2 lbs  Pelvic:   External: Normal external female genitalia  Cervix:10/100/+2 on arrival to L&D with bulging membranes    Extremities: non-tender, symmetric, no edema bilaterally.  DTRs: 2+/2+  Neurologic: Alert & oriented x 3.    No results found for this or any previous visit (from the past 24 hours).  Pertinent Results:  Prenatal Labs: Blood type/Rh O pos  Antibody screen neg  Rubella Immune  Varicella Immune  RPR NR  HBsAg Neg  HIV NR  GC neg  Chlamydia neg  Genetic screening negative  1 hour GTT 86  3 hour GTT    GBS Negative    FHT:  145 bpm by doppler, no decreases auscultated  Category/reactivity:  Category I UC:   regular, every 2-3 minutes, strong to palpation    Cephalic by SVE   US  OB Limited Result Date: 01/26/2024 CLINICAL DATA:  Fetal growth restriction. Assigned gestational age [redacted] weeks 0 days EXAM: LIMITED OBSTETRIC ULTRASOUND COMPARISON:  Obstetric ultrasound dated 01/16/2024 FINDINGS: Number of Fetuses: 1 Heart Rate:  136 bpm Movement: Yes Presentation: Cephalic Placental Location: Anterior Previa: No Amniotic Fluid (Subjective):  Within normal limits. AFI: 11.6 cm BPD: 8.46 cm 34 w  1 d MATERNAL FINDINGS: Cervix: Not well seen due to advanced gestational age and fetal presentation. Uterus/Adnexae: No abnormality visualized. IMPRESSION: 1. Single active intrauterine pregnancy in cephalic presentation measures 34 weeks 1 day gestation by biparietal diameter. 2. Normal AFI measures 11.6 cm. 3. No sonographic findings of placental abruption. This exam is performed on an emergent basis and does not comprehensively evaluate fetal size, dating, or anatomy; follow-up complete OB US  should be considered if further fetal assessment is warranted. Electronically Signed   By: Limin  Xu M.D.   On: 01/26/2024 19:13   US  OB Comp + 14 Wk Result Date: 01/16/2024 CLINICAL DATA:  Preterm labor EXAM: OBSTETRICAL ULTRASOUND >14 WKS FINDINGS: Number  of Fetuses: 1 Heart Rate:  143 bpm Movement: Yes Presentation: Cephalic Previa: No Placental Location: Anterior Amniotic Fluid (Subjective): Normal Amniotic Fluid (Objective): Vertical pocket = 4.5cm AFI = 11.9 cm FETAL BIOMETRY BPD: 8.29cm 33w 2d HC: 31.23cm  35w   0d AC: 29.47cm  33w   3d FL: 6.54cm  33w   5d Current Mean GA: 33w 5d US  EDC: 02/29/2024 Assigned GA:  35w 4d Assigned EDC: 02/16/2024 Estimated Fetal Weight:  2249.2g 8.7%ile FETAL ANATOMY Lateral Ventricles: Appears normal Thalami/CSP: Appears normal Posterior Fossa:  Not visualized Nuchal Region: Not visualized Upper Lip: Appears normal Spine: Not visualized 4 Chamber Heart on Left: Appears normal LVOT: Not visualized RVOT: Not visualized Stomach on Left: Appears normal 3 Vessel Cord: Appears normal Cord Insertion site: Not visualized Kidneys: Asymmetrically larger right kidney. Otherwise normal appearance. Bladder: Appears normal Extremities: Limited views appear normal Sex: Not Visualized Technically difficult due to: Fetal positioning  Maternal Findings: Cervix:  Not well seen due to fetal presentation. IMPRESSION: 1. Single active intrauterine pregnancy with estimated gestational age [redacted] weeks 5 days, 2 weeks behind assigned gestational age of [redacted] weeks 4 days. 2. Estimated fetal weight 8.7 percentile. 3. Fetal anatomic survey as described. Asymmetrically larger right kidney, which may be artifactual related to fetal positioning and technique. Recommend postnatal ultrasound examination for further evaluation. Electronically Signed   By: Limin  Xu M.D.   On: 01/16/2024 11:36    Assessment:  Bethany Macdonald is a 23 y.o. [id number] female at [redacted]w[redacted]d with advanced labor and immanent delivery.   Plan:  1. Admit to Labor & Delivery - Admission status: Inpatient - Dr. Nickey Barn MD notified of admission and plan of care  - Reason for admission: labor management - consents reviewed and obtained - Delivery room rapidly prepared for immanent delivery  -  Anticipate vaginal delivery   2. Fetal Well being  - Fetal Tracing: cat I with intermittent auscultation  - Group B Streptococcus ppx not indicated: GBS negative - Presentation: cephalic confirmed by SVE   3. Routine OB: - Prenatal labs reviewed, as above - Rh positive - CBC, T&S, RPR on admit - collected after delivery   4. Post Partum Planning: - Infant feeding: formula feeding - Contraception: Depo-Provera  - Flu vaccine: declined  - Tdap vaccine: declined  - RSV vaccine: not in season   Teodora Fell, [place] 02/04/24 10:57 AM  Fraser Jackson, CNM Certified Nurse Midwife [facility]

## 2024-02-05 LAB — CBC
HCT: 31 % — ABNORMAL LOW (ref 36.0–46.0)
Hemoglobin: 9.9 g/dL — ABNORMAL LOW (ref 12.0–15.0)
MCH: 27.3 pg (ref 26.0–34.0)
MCHC: 31.9 g/dL (ref 30.0–36.0)
MCV: 85.6 fL (ref 80.0–100.0)
Platelets: 195 10*3/uL (ref 150–400)
RBC: 3.62 MIL/uL — ABNORMAL LOW (ref 3.87–5.11)
RDW: 15.4 % (ref 11.5–15.5)
WBC: 8.7 10*3/uL (ref 4.0–10.5)
nRBC: 0 % (ref 0.0–0.2)

## 2024-02-05 LAB — RPR: RPR Ser Ql: NONREACTIVE

## 2024-02-05 NOTE — Progress Notes (Signed)
 Patient discharged home with family.  Discharge instructions, when to follow up, and medications reviewed with patient.  Patient verbalized understanding. Patient will be escorted out by staff.
